# Patient Record
Sex: Female | Born: 1985
Health system: Southern US, Community
[De-identification: ages and names within clinical notes are randomized; demographics above are authoritative.]

## PROBLEM LIST (undated history)

## (undated) DIAGNOSIS — F419 Anxiety disorder, unspecified: Secondary | ICD-10-CM

## (undated) HISTORY — PX: WISDOM TOOTH EXTRACTION: SHX21

## (undated) HISTORY — PX: TYMPANOSTOMY TUBE PLACEMENT: SHX32

## (undated) HISTORY — PX: TONSILLECTOMY: SUR1361

---

## 2010-04-24 ENCOUNTER — Ambulatory Visit: Payer: Self-pay | Admitting: Internal Medicine

## 2017-09-02 DIAGNOSIS — K529 Noninfective gastroenteritis and colitis, unspecified: Secondary | ICD-10-CM | POA: Diagnosis not present

## 2017-11-18 DIAGNOSIS — Z124 Encounter for screening for malignant neoplasm of cervix: Secondary | ICD-10-CM | POA: Diagnosis not present

## 2017-11-18 DIAGNOSIS — Z0000 Encounter for general adult medical examination without abnormal findings: Secondary | ICD-10-CM | POA: Diagnosis not present

## 2018-06-06 DIAGNOSIS — L259 Unspecified contact dermatitis, unspecified cause: Secondary | ICD-10-CM | POA: Diagnosis not present

## 2019-04-26 ENCOUNTER — Other Ambulatory Visit: Payer: Self-pay

## 2019-04-26 ENCOUNTER — Encounter: Payer: Self-pay | Admitting: Allergy and Immunology

## 2019-04-26 ENCOUNTER — Ambulatory Visit: Payer: 59 | Admitting: Allergy and Immunology

## 2019-04-26 VITALS — BP 116/64 | HR 68 | Temp 98.3°F | Resp 18 | Ht 62.3 in | Wt 187.0 lb

## 2019-04-26 DIAGNOSIS — L299 Pruritus, unspecified: Secondary | ICD-10-CM | POA: Diagnosis not present

## 2019-04-26 NOTE — Patient Instructions (Addendum)
  1.  Taper cetirizine 5 mg daily for 1 month followed by 2.5 mg daily for 1 month then discontinue  2.  Further evaluation?

## 2019-04-26 NOTE — Progress Notes (Signed)
[facility] [place]   NEW PATIENT NOTE  Referring Provider: No ref. provider found Primary Provider: Marinda Elk, MD Date of office visit: 04/26/2019    Subjective:   Chief Complaint:  Rhonda Yates (DOB: [birth year]) is a 34 y.o. female who presents to the clinic on 04/26/2019 with a chief complaint of Pruritus .  HPI: Rhonda Yates presents to this clinic in evaluation of itchiness if she is not using Zyrtec.  Apparently she has been on Zyrtec for greater than a decade for issues with pollen allergies usually involving her upper airway.  If she stops Zyrtec at this point she develops itchiness within 24 to 48 hours.  She has a history of dermatographia which has been a longstanding issue without any other associated systemic or constitutional symptoms and that also appears to possibly get a little bit worse when she stops her Zyrtec.  Overall her atopic disease appears to have been better as she has aged.  She does not really have a history of asthma or food allergies or other atopic disease other than some allergic rhinitis.  She has not had a investigation of her pruritus.  However, she did have a physical exam in August 2020 and had some blood test performed.  History reviewed. No pertinent past medical history.  Past Surgical History:  Procedure Laterality Date  . TONSILLECTOMY    . TYMPANOSTOMY TUBE PLACEMENT    . WISDOM TOOTH EXTRACTION      Allergies as of 04/26/2019      Reactions   Penicillins    Other reaction(s): Other (See Comments), Unknown Childhood allergy unsure of reaction but can take cephalosporins       Medication List      Cetirizine HCl 10 MG Caps Take 10 mg by mouth daily.   citalopram 10 MG tablet Commonly known as: CELEXA SMARTSIG:3 Tablet(s) By Mouth Every Other Day PRN   multivitamin tablet Take 1 tablet by mouth daily.   PROBIOTIC PO Take by mouth daily.   VITAMIN C PO Take by mouth  daily.       Review of systems negative except as noted in HPI / PMHx or noted below:  Review of Systems  Constitutional: Negative.   HENT: Negative.   Eyes: Negative.   Respiratory: Negative.   Cardiovascular: Negative.   Gastrointestinal: Negative.   Genitourinary: Negative.   Musculoskeletal: Negative.   Skin: Negative.   Neurological: Negative.   Endo/Heme/Allergies: Negative.   Psychiatric/Behavioral: Negative.     Family History  Problem Relation Age of Onset  . Kidney cancer Mother   . High blood pressure Mother   . Cancer Mother        Neuroendocrine cancer    Social History   Socioeconomic History  . Marital status: Married    Spouse name: Not on file  . Number of children: Not on file  . Years of education: Not on file  . Highest education level: Not on file  Occupational History  . Not on file  Tobacco Use  . Smoking status: Never Smoker  . Smokeless tobacco: Never Used  Substance and Sexual Activity  . Alcohol use: Yes  . Drug use: Never  . Sexual activity: Not on file  Other Topics Concern  . Not on file  Social History Narrative  . Not on file   Social Determinants of Health   Financial Resource Strain:   . Difficulty of Paying Living Expenses: Not  on file  Food Insecurity:   . Worried About Charity fundraiser in the Last Year: Not on file  . Ran Out of Food in the Last Year: Not on file  Transportation Needs:   . Lack of Transportation (Medical): Not on file  . Lack of Transportation (Non-Medical): Not on file  Physical Activity:   . Days of Exercise per Week: Not on file  . Minutes of Exercise per Session: Not on file  Stress:   . Feeling of Stress : Not on file  Social Connections:   . Frequency of Communication with Friends and Family: Not on file  . Frequency of Social Gatherings with Friends and Family: Not on file  . Attends Religious Services: Not on file  . Active Member of Clubs or Organizations: Not on file  . Attends  Archivist Meetings: Not on file  . Marital Status: Not on file  Intimate Partner Violence:   . Fear of Current or Ex-Partner: Not on file  . Emotionally Abused: Not on file  . Physically Abused: Not on file  . Sexually Abused: Not on file    Environmental and Social history  Lives in a house with a dry environment, a dog located inside the household, no carpet in the bedroom, plastic on the bed, no plastic on the pillow, no smoking ongoing with inside the household.  She works as a Probation officer and evaluates patients.  Objective:   Vitals:   04/26/19 1347  BP: 116/64  Pulse: 68  Resp: 18  Temp: 98.3 F (36.8 C)  SpO2: 97%   Height: 5' 2.3" (158.2 cm) Weight: 187 lb (84.8 kg)  Physical Exam Constitutional:      Appearance: She is not diaphoretic.  HENT:     Head: Normocephalic. No right periorbital erythema or left periorbital erythema.     Right Ear: Tympanic membrane, ear canal and external ear normal.     Left Ear: Tympanic membrane, ear canal and external ear normal.     Nose: Nose normal. No mucosal edema or rhinorrhea.     Mouth/Throat:     Pharynx: Uvula midline. No oropharyngeal exudate.  Eyes:     General: Lids are normal.     Conjunctiva/sclera: Conjunctivae normal.     Pupils: Pupils are equal, round, and reactive to light.  Neck:     Thyroid: No thyromegaly.     Trachea: Trachea normal. No tracheal tenderness or tracheal deviation.  Cardiovascular:     Rate and Rhythm: Normal rate and regular rhythm.     Heart sounds: Normal heart sounds, S1 normal and S2 normal. No murmur.  Pulmonary:     Effort: Pulmonary effort is normal. No respiratory distress.     Breath sounds: Normal breath sounds. No stridor. No wheezing or rales.  Chest:     Chest wall: No tenderness.  Abdominal:     General: There is no distension.     Palpations: Abdomen is soft. There is no mass.     Tenderness: There is no abdominal tenderness. There is no guarding or  rebound.  Musculoskeletal:        General: No tenderness.  Lymphadenopathy:     Head:     Right side of head: No tonsillar adenopathy.     Left side of head: No tonsillar adenopathy.     Cervical: No cervical adenopathy.  Skin:    Coloration: Skin is not pale.     Findings: No erythema or rash.  Nails: There is no clubbing.  Neurological:     Mental Status: She is alert.     Diagnostics: Allergy skin tests were not performed.   Results of blood tests obtained 20 November 2018 identifies a WBC 5.9, absolute eosinophil 60, absolute lymphocyte 2310, hemoglobin 14.7, platelet 292, creatinine 0.8 mg/DL, AST 20 1U/L, ALT 20 9U/L, negative hepatitis C antibody  Assessment and Plan:    1. Pruritic disorder     1.  Taper cetirizine 5 mg daily for 1 month followed by 2.5 mg daily for 1 month then discontinue  2.  Further evaluation?  Before we investigate for a possible systemic disease or immune activation regarding Sherin's pruritic disorder we are going to have her slowly taper off Zyrtec.  I suspect the issue that we are dealing with at this point regarding her pruritus is 1 of cetirizine withdrawal and we will slowly taper her off this agent to see if this is the case.  If she still remains pruritic after this slow taper then she may require further evaluation and she will contact me by clinic noting her response to this tapering.  Allena Katz, MD Allergy / Immunology [place]

## 2019-04-30 ENCOUNTER — Encounter: Payer: Self-pay | Admitting: Allergy and Immunology

## 2019-05-30 ENCOUNTER — Telehealth: Payer: Self-pay | Admitting: Allergy and Immunology

## 2019-05-30 NOTE — Telephone Encounter (Signed)
Rhonda Yates called in and states she has followed Dr. Bruna Potter advice on tapering herself off Zyrtec with the exception of the 1/4 pill.  She is currently taking 1/2 a Zyrtec daily.  Dorenda states she can't find a pill cutter that can cut the pill into a 1/4.  She tried to see how it would go by stopping the 1/2 pill and she states she is still having the itching.  Alexah states she is still getting red blotches everywhere.  Please advise.

## 2019-05-31 NOTE — Telephone Encounter (Signed)
Patient informed. 

## 2019-05-31 NOTE — Telephone Encounter (Signed)
Please inform patient that she can use liquid cetirizine which is 5 mg / 5 mL to continue her very slow taper off this medication.  If she cannot taper off this medicine completely and developed skin problems while utilizing this plan then she needs to contact me for further evaluation and treatment.

## 2019-07-31 ENCOUNTER — Other Ambulatory Visit: Payer: Self-pay

## 2019-07-31 ENCOUNTER — Ambulatory Visit
Admission: EM | Admit: 2019-07-31 | Discharge: 2019-07-31 | Disposition: A | Discharge status: Home / Self Care | Payer: 59 | Attending: Family Medicine | Admitting: Family Medicine

## 2019-07-31 ENCOUNTER — Encounter: Payer: Self-pay | Admitting: Emergency Medicine

## 2019-07-31 DIAGNOSIS — R059 Cough, unspecified: Secondary | ICD-10-CM

## 2019-07-31 DIAGNOSIS — H66001 Acute suppurative otitis media without spontaneous rupture of ear drum, right ear: Secondary | ICD-10-CM | POA: Diagnosis not present

## 2019-07-31 DIAGNOSIS — R05 Cough: Secondary | ICD-10-CM

## 2019-07-31 MED ORDER — CEFDINIR 300 MG PO CAPS: 300.0000 mg | ORAL_CAPSULE | Freq: Two times a day (BID) | ORAL | 0 refills | Status: AC

## 2019-07-31 NOTE — Discharge Instructions (Signed)
It was very nice seeing you today in clinic. Thank you for entrusting me with your care.   Rest and Stay HYDRATED. Water and electrolyte containing beverages (Gatorade, Pedialyte) are best to prevent dehydration and electrolyte abnormalities. Call us and let us know about your COVID test results when you get them today. May use Tylenol and/or Ibuprofen as needed for pain/fever.   Make arrangements to follow up with your regular doctor in 1 week for re-evaluation if not improving. If your symptoms/condition worsens, please seek follow up care either here or in the ER. Please remember, our [facility] providers are "right here with you" when you need [place].   Again, it was my pleasure to take care of you today. Thank you for choosing our clinic. I hope that you start to feel better quickly.   Honor Loh, MSN, APRN, FNP-C, CEN Advanced Practice Provider [facility]

## 2019-07-31 NOTE — ED Provider Notes (Addendum)
[place], [place]   Name: Rhonda Yates DOB: Mar 01, 1986 MRN: [id number] CSN: [id number] PCP: Marinda Elk, MD  Arrival date and time:  07/31/19 1522  Chief Complaint:  Otalgia, Fever, and Nasal Congestion  NOTE: Prior to seeing the patient today, I have reviewed the triage nursing documentation and vital signs. Clinical staff has updated patient's PMH/PSHx, current medication list, and drug allergies/intolerances to ensure comprehensive history available to assist in medical decision making.   History:   HPI: Rhonda Yates is a 34 y.o. female who presents today with complaints of rhinorrhea, diffuse myalgia, cough, RIGHT ear pain, and a generalized headache that started yesterday. Patient endorses fevers to a Tmax of 101.2. Cough has been non-productive with no associated shortness of breath or wheezing. She denies that she has experienced any nausea, vomiting, diarrhea, or abdominal pain. She notes a decreased appetite overall, however maintains the ability to tolerate oral fluids. Patient denies any perceived alterations to her sense of taste or smell. Patient denies being in close contact with anyone known to be ill; no one else is her home has experienced a similar symptom constellation. 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 Patient was tested earlier today for SARS-CoV-2 at [facility]. Her rapid Ag testing was (-); molecular PCR testing pending. In efforts to conservatively manage her symptoms at home, the patient notes that she has used APA cold/sinus, guaifenesin-DM, and azelastine, which has helped to improve her symptoms "some".  History reviewed. No pertinent past medical history.  Past Surgical History:  Procedure Laterality Date  . TONSILLECTOMY    . TYMPANOSTOMY TUBE PLACEMENT    . WISDOM TOOTH EXTRACTION      Family History  Problem Relation Age of Onset  .  Kidney cancer Mother   . High blood pressure Mother   . Cancer Mother        Neuroendocrine cancer    Social History   Tobacco Use  . Smoking status: Never Smoker  . Smokeless tobacco: Never Used  Substance Use Topics  . Alcohol use: Yes  . Drug use: Never    There are no problems to display for this patient.   Home Medications:    Current Meds  Medication Sig  . Ascorbic Acid (VITAMIN C PO) Take by mouth daily.  . Cetirizine HCl 10 MG CAPS Take 10 mg by mouth daily.   . citalopram (CELEXA) 10 MG tablet SMARTSIG:3 Tablet(s) By Mouth Every Other Day PRN  . Multiple Vitamin (MULTIVITAMIN) tablet Take 1 tablet by mouth daily.  . Probiotic Product (PROBIOTIC PO) Take by mouth daily.    Allergies:   Penicillins  Review of Systems (ROS):  Review of systems NEGATIVE unless otherwise noted in narrative H&P section.   Vital Signs: Today's Vitals   07/31/19 1547 07/31/19 1549 07/31/19 1637  BP:  126/77   Pulse:  87   Resp:  18   Temp:  100.2 F (37.9 C)   TempSrc:  Oral   SpO2:  99%   Weight: 192 lb (87.1 kg)    Height: 5\' 2"  (1.575 m)    PainSc: 5   5     Physical Exam: Physical Exam  Constitutional: She is oriented to person, place, and time and well-developed, well-nourished, and in no distress.  Acutely ill appearing; fatigued.   HENT:  Head: Normocephalic and atraumatic.  Right Ear: There is swelling and tenderness.  Tympanic membrane is erythematous and bulging. A middle ear effusion (suppurative) is present.  Left Ear: Tympanic membrane is bulging. A middle ear effusion (serous) is present.  Nose: Rhinorrhea present. No mucosal edema or sinus tenderness.  Mouth/Throat: Uvula is midline and mucous membranes are normal. Posterior oropharyngeal erythema present. No oropharyngeal exudate or posterior oropharyngeal edema.  Eyes: Pupils are equal, round, and reactive to light.  Cardiovascular: Normal rate, regular rhythm, normal heart sounds and intact distal  pulses.  Pulmonary/Chest: Effort normal and breath sounds normal.  Mild cough noted in clinic. No SOB or increased WOB. No distress. Able to speak in complete sentences without difficulties. SPO2 99% on RA.  Lymphadenopathy:       Head (right side): Submandibular adenopathy present.  Neurological: She is alert and oriented to person, place, and time. Gait normal.  Skin: Skin is warm and dry. No rash noted. She is not diaphoretic.  Psychiatric: Mood, memory, affect and judgment normal.  Nursing note and vitals reviewed.   Urgent Care Treatments / Results:   No orders of the defined types were placed in this encounter.   LABS: PLEASE NOTE: all labs that were ordered this encounter are listed, however only abnormal results are displayed. Labs Reviewed - No data to display  EKG: -None  RADIOLOGY: No results found.  PROCEDURES: Procedures  MEDICATIONS RECEIVED THIS VISIT: Medications - No data to display  PERTINENT CLINICAL COURSE NOTES/UPDATES:   Initial Impression / Assessment and Plan / Urgent Care Course:  Pertinent labs & imaging results that were available during my care of the patient were personally reviewed by me and considered in my medical decision making (see lab/imaging section of note for values and interpretations).  Rhonda Yates is a 34 y.o. female who presents to [facility] today with complaints of Otalgia, Fever, and Nasal Congestion  Patient acutely ill appearing (non-toxic) appearing in clinic today. She does not appear to be in any acute distress. Presenting symptoms (see HPI) and exam as documented above. She presents with symptoms associated with SARS-CoV-2 (novel coronavirus). Discussed typical symptom constellation. Rapid testing (-) at outside facility; molecular PCR testing pending. Exam consistent with AOME on the RIGHT. Discussed potential for concurrent ear infection, thus until ruled out with confirmatory lab testing, SARS-CoV-2 remains  part of the differential. Treating with a 10 day course of cefdinir. Reviewed supportive day; rest, hydration, and PRN doses of APAP/IBU. Patient to continue guaifenesin and azelastine.  Intervention for cough offered, however patient declined citing that her symptoms are mild/controlled.  Current clinical condition warrants patient being out of work in order to quarantine while waiting for testing results. She was provided with the appropriate documentation to provide to her place of employment that will allow for her to RTW on 08/03/2019 with no restrictions. RTW is contingent on her SARS-CoV-2 test results being reviewed as negative.     Discussed follow up with primary care physician in 1 week for re-evaluation. I have reviewed the follow up and strict return precautions for any new or worsening symptoms. Patient is aware of symptoms that would be deemed urgent/emergent, and would thus require further evaluation either here or in the emergency department. At the time of discharge, she verbalized understanding and consent with the discharge plan as it was reviewed with her. All questions were fielded by provider and/or clinic staff prior to patient discharge.    Final Clinical Impressions / Urgent Care Diagnoses:   Final diagnoses:  Non-recurrent acute suppurative otitis media  of right ear without spontaneous rupture of tympanic membrane  Cough    New Prescriptions:  Industry Controlled Substance Registry consulted? Not Applicable  Meds ordered this encounter  Medications  . cefdinir (OMNICEF) 300 MG capsule    Sig: Take 1 capsule (300 mg total) by mouth 2 (two) times daily for 10 days.    Dispense:  20 capsule    Refill:  0    Recommended Follow up Care:  Patient encouraged to follow up with the following provider within the specified time frame, or sooner as dictated by the severity of her symptoms. As always, she was instructed that for any urgent/emergent care needs, she should seek care  either here or in the emergency department for more immediate evaluation.  Follow-up Information    Marinda Elk, MD In 1 week.   Specialty: Physician Assistant Why: General reassessment of symptoms if not improving Contact information: [place] [address] [phone]         NOTE: This note was prepared using Dragon dictation software along with smaller phrase technology. Despite my best ability to proofread, there is the potential that transcriptional errors may still occur from this process, and are completely unintentional.     Karen Kitchens, NP 07/31/19 1712

## 2019-07-31 NOTE — ED Triage Notes (Addendum)
Patient c/o right ear pain, headache, generalized body aches, nasal congestion and fever that started yesterday. Patient had a negative covid test this morning and states the PCR results should be back this afternoon.

## 2019-11-13 ENCOUNTER — Encounter: Payer: Self-pay | Admitting: Emergency Medicine

## 2019-11-13 ENCOUNTER — Ambulatory Visit
Admission: EM | Admit: 2019-11-13 | Discharge: 2019-11-13 | Disposition: A | Discharge status: Home / Self Care | Payer: 59 | Attending: Family Medicine | Admitting: Family Medicine

## 2019-11-13 ENCOUNTER — Other Ambulatory Visit: Payer: Self-pay

## 2019-11-13 DIAGNOSIS — S90812A Abrasion, left foot, initial encounter: Secondary | ICD-10-CM | POA: Diagnosis not present

## 2019-11-13 HISTORY — DX: Anxiety disorder, unspecified: F41.9

## 2019-11-13 MED ORDER — MUPIROCIN 2 % EX OINT: 1.0000 "application " | TOPICAL_OINTMENT | Freq: Two times a day (BID) | CUTANEOUS | 0 refills | Status: AC

## 2019-11-13 NOTE — Discharge Instructions (Signed)
Keep it clean.  Ice and elevate.  If worsens, let me know and I will send in an oral antibiotic.  Take care  Dr. Lacinda Axon

## 2019-11-13 NOTE — ED Triage Notes (Signed)
Patient in today c/o left foot pain, redness and swelling x 3 days. Patient states she was using a razor to shave and scraped the top of her left foot. Patient cleaned with peroxide, neosporin and covering with bandaids.

## 2019-11-13 NOTE — ED Provider Notes (Signed)
[facility]    CSN: [id number] Arrival date & time: 11/13/19  1727      History   Chief Complaint Chief Complaint  Patient presents with   Foot Injury    left   Abrasion   HPI  34 year old female presents with the above complaint.  Patient reports that she recently shaving and suffered abrasions to the dorsum of the left foot.  Patient reports that she has swam in the [place] recently.  She is concerned as she feels that her left foot is now red and swollen.  She has cleaned the area and used topical Neosporin without resolution.  She states that she was advised by her family members to come in for evaluation.  No fever.  No relieving factors.  No other complaints.  Past Medical History:  Diagnosis Date   Anxiety    Past Surgical History:  Procedure Laterality Date   TONSILLECTOMY     TYMPANOSTOMY TUBE PLACEMENT     WISDOM TOOTH EXTRACTION     OB History   No obstetric history on file.     Home Medications    Prior to Admission medications   Medication Sig Start Date End Date Taking? Authorizing Provider  Ascorbic Acid (VITAMIN C PO) Take by mouth daily.   Yes [provider]  Cetirizine HCl 10 MG CAPS Take 10 mg by mouth daily.    Yes [provider]  citalopram (CELEXA) 10 MG tablet SMARTSIG:3 Tablet(s) By Mouth Every Other Day PRN 04/23/19  Yes [provider]  Prenatal Vit-Fe Fumarate-FA (PRENATAL PO) Take 1 tablet by mouth daily.   Yes [provider]  Probiotic Product (PROBIOTIC PO) Take by mouth daily.   Yes [provider]  Multiple Vitamin (MULTIVITAMIN) tablet Take 1 tablet by mouth daily.    [provider]  mupirocin ointment (BACTROBAN) 2 % Apply 1 application topically 2 (two) times daily for 7 days. 11/13/19 11/20/19  Coral Spikes, DO    Family History Family History  Problem Relation Age of Onset   Kidney cancer Mother    High blood pressure Mother    Cancer Mother         Neuroendocrine cancer   Healthy Father     Social History Social History   Tobacco Use   Smoking status: Never Smoker   Smokeless tobacco: Never Used  Scientific laboratory technician Use: Never used  Substance Use Topics   Alcohol use: Yes    Comment: social   Drug use: Never     Allergies   Penicillins   Review of Systems Review of Systems  Constitutional: Negative.   Musculoskeletal:       Foot swelling.   Physical Exam Triage Vital Signs ED Triage Vitals  Enc Vitals Group     BP 11/13/19 1740 115/78     Pulse Rate 11/13/19 1740 (!) 57     Resp 11/13/19 1740 18     Temp 11/13/19 1740 98.5 F (36.9 C)     Temp Source 11/13/19 1740 Oral     SpO2 11/13/19 1740 100 %     Weight 11/13/19 1741 192 lb 3.2 oz (87.2 kg)     Height 11/13/19 1741 5\' 2"  (1.575 m)     Head Circumference --      Peak Flow --      Pain Score 11/13/19 1740 2     Pain Loc --      Pain Edu? --  Excl. in [place]? --    Updated Vital Signs BP 115/78 (BP Location: Left Arm)    Pulse (!) 57    Temp 98.5 F (36.9 C) (Oral)    Resp 18    Ht 5\' 2"  (1.575 m)    Wt 87.2 kg    LMP 10/17/2019 (Exact Date)    SpO2 100%    BMI 35.15 kg/m   Visual Acuity Right Eye Distance:   Left Eye Distance:   Bilateral Distance:    Right Eye Near:   Left Eye Near:    Bilateral Near:     Physical Exam Constitutional:      General: She is not in acute distress.    Appearance: Normal appearance. She is not ill-appearing.  HENT:     Head: Normocephalic and atraumatic.  Eyes:     General:        Right eye: No discharge.        Left eye: No discharge.     Conjunctiva/sclera: Conjunctivae normal.  Pulmonary:     Effort: Pulmonary effort is normal. No respiratory distress.  Skin:    Comments: Dorsum of the left foot with 2 small abrasions.  There is no significant surrounding erythema or warmth.  Neurological:     Mental Status: She is alert.  Psychiatric:        Mood and Affect: Mood normal.        Behavior:  Behavior normal.    UC Treatments / Results  Labs (all labs ordered are listed, but only abnormal results are displayed) Labs Reviewed - No data to display  EKG   Radiology No results found.  Procedures Procedures (including critical care time)  Medications Ordered in UC Medications - No data to display  Initial Impression / Assessment and Plan / UC Course  I have reviewed the triage vital signs and the nursing notes.  Pertinent labs & imaging results that were available during my care of the patient were reviewed by me and considered in my medical decision making (see chart for details).    34 year old female presents with an abrasion to the left foot.  Patient actually has 2 abrasions.  There is no evidence of cellulitis at this time.  No significant swelling on exam.  Bactroban ointment as prescribed.  Supportive care.  Final Clinical Impressions(s) / UC Diagnoses   Final diagnoses:  Abrasion of left foot, initial encounter     Discharge Instructions     Keep it clean.  Ice and elevate.  If worsens, let me know and I will send in an oral antibiotic.  Take care  Dr. Lacinda Axon    ED Prescriptions    Medication Sig Dispense Auth. Provider   mupirocin ointment (BACTROBAN) 2 % Apply 1 application topically 2 (two) times daily for 7 days. 22 g Coral Spikes, DO     PDMP not reviewed this encounter.   Coral Spikes, [place] 11/13/19 1835

## 2020-01-08 ENCOUNTER — Encounter: Payer: Self-pay | Admitting: Obstetrics & Gynecology

## 2020-01-08 ENCOUNTER — Ambulatory Visit (INDEPENDENT_AMBULATORY_CARE_PROVIDER_SITE_OTHER): Payer: 59 | Admitting: Obstetrics & Gynecology

## 2020-01-08 ENCOUNTER — Other Ambulatory Visit (HOSPITAL_COMMUNITY)
Admission: RE | Admit: 2020-01-08 | Discharge: 2020-01-08 | Disposition: A | Discharge status: Home / Self Care | Payer: 59 | Source: Ambulatory Visit | Attending: Obstetrics & Gynecology | Admitting: Obstetrics & Gynecology

## 2020-01-08 ENCOUNTER — Other Ambulatory Visit: Payer: Self-pay

## 2020-01-08 VITALS — BP 126/70 | Wt 197.0 lb

## 2020-01-08 DIAGNOSIS — N926 Irregular menstruation, unspecified: Secondary | ICD-10-CM

## 2020-01-08 DIAGNOSIS — Z3A08 8 weeks gestation of pregnancy: Secondary | ICD-10-CM

## 2020-01-08 DIAGNOSIS — Z369 Encounter for antenatal screening, unspecified: Secondary | ICD-10-CM

## 2020-01-08 LAB — POCT URINE PREGNANCY: Preg Test, Ur: POSITIVE — AB

## 2020-01-08 NOTE — Progress Notes (Signed)
01/08/2020   Chief Complaint: Missed period  Transfer of Care Patient: no  History of Present Illness: Rhonda Yates is a 34 y.o. G1P0 [redacted]w[redacted]d based on Patient's last menstrual period was 11/13/2019. with an Estimated Date of Delivery: 08/19/20, with the above CC.   Her periods were: irregular periods from 45 to 60 days She was using no method when she conceived.  She has Positive signs or symptoms of nausea/vomiting of pregnancy. She has Negative signs or symptoms of miscarriage or preterm labor She identifies Negative Zika risk factors for her and her partner On any different medications around the time she conceived/early pregnancy: Yes   - Pt takes Celexa History of varicella: Yes   ROS: A 12-point review of systems was performed and negative, except as stated in the above HPI.  OBGYN History: As per HPI. OB History  Gravida Para Term Preterm AB Living  1            SAB TAB Ectopic Multiple Live Births               # Outcome Date GA Lbr Len/2nd Weight Sex Delivery Anes PTL Lv  1 Current             Any issues with any prior pregnancies: N/A Any prior children are healthy, doing well, without any problems or issues: not applicable History of pap smears: Yes. Last pap smear 2020.  Abnormal: no  History of STIs: No   Past Medical History: Past Medical History:  Diagnosis Date  . Anxiety     Past Surgical History: Past Surgical History:  Procedure Laterality Date  . TONSILLECTOMY    . TYMPANOSTOMY TUBE PLACEMENT    . WISDOM TOOTH EXTRACTION      Family History:  Family History  Problem Relation Age of Onset  . Kidney cancer Mother   . High blood pressure Mother   . Cancer Mother        Neuroendocrine cancer  . Healthy Father    She denies any female cancers, bleeding or blood clotting disorders.  She denies any history of mental retardation, birth defects or genetic disorders in her or the FOB's history.  One cousin w Energy manager  Social History:  Social History     Socioeconomic History  . Marital status: Married    Spouse name: Not on file  . Number of children: Not on file  . Years of education: Not on file  . Highest education level: Not on file  Occupational History  . Not on file  Tobacco Use  . Smoking status: Never Smoker  . Smokeless tobacco: Never Used  Vaping Use  . Vaping Use: Never used  Substance and Sexual Activity  . Alcohol use: Yes    Comment: social  . Drug use: Never  . Sexual activity: Yes  Other Topics Concern  . Not on file  Social History Narrative  . Not on file   Social Determinants of Health   Financial Resource Strain:   . Difficulty of Paying Living Expenses: Not on file  Food Insecurity:   . Worried About Charity fundraiser in the Last Year: Not on file  . Ran Out of Food in the Last Year: Not on file  Transportation Needs:   . Lack of Transportation (Medical): Not on file  . Lack of Transportation (Non-Medical): Not on file  Physical Activity:   . Days of Exercise per Week: Not on file  . Minutes of Exercise per Session:  Not on file  Stress:   . Feeling of Stress : Not on file  Social Connections:   . Frequency of Communication with Friends and Family: Not on file  . Frequency of Social Gatherings with Friends and Family: Not on file  . Attends Religious Services: Not on file  . Active Member of Clubs or Organizations: Not on file  . Attends Archivist Meetings: Not on file  . Marital Status: Not on file  Intimate Partner Violence:   . Fear of Current or Ex-Partner: Not on file  . Emotionally Abused: Not on file  . Physically Abused: Not on file  . Sexually Abused: Not on file   Any pets in the household: no  Allergy: Allergies  Allergen Reactions  . Penicillins     Other reaction(s): Other (See Comments), Unknown Childhood allergy unsure of reaction but can take cephalosporins      Current Outpatient Medications:  Current Outpatient Medications:  .  Cetirizine HCl 10  MG CAPS, Take 10 mg by mouth daily. , Disp: , Rfl:  .  citalopram (CELEXA) 10 MG tablet, SMARTSIG:3 Tablet(s) By Mouth Every Other Day PRN, Disp: , Rfl:  .  Multiple Vitamin (MULTIVITAMIN) tablet, Take 1 tablet by mouth daily., Disp: , Rfl:  .  Prenatal Vit-Fe Fumarate-FA (PRENATAL PO), Take 1 tablet by mouth daily., Disp: , Rfl:  .  Probiotic Product (PROBIOTIC PO), Take by mouth daily., Disp: , Rfl:  .  Ascorbic Acid (VITAMIN C PO), Take by mouth daily. (Patient not taking: Reported on 01/08/2020), Disp: , Rfl:    Physical Exam:   BP 126/70   Wt 197 lb (89.4 kg)   LMP 11/13/2019   BMI 36.03 kg/m  Body mass index is 36.03 kg/m. Constitutional: Well nourished, well developed female in no acute distress.  Neck:  Supple, normal appearance, and no thyromegaly  Cardiovascular: S1, S2 normal, no murmur, rub or gallop, regular rate and rhythm Respiratory:  Clear to auscultation bilateral. Normal respiratory effort Abdomen: positive bowel sounds and no masses, hernias; diffusely non tender to palpation, non distended Breasts: breasts appear normal, no suspicious masses, no skin or nipple changes or axillary nodes. Neuro/Psych:  Normal mood and affect.  Skin:  Warm and dry.  Lymphatic:  No inguinal lymphadenopathy.   Pelvic exam: is not limited by body habitus EGBUS: within normal limits, Vagina: within normal limits and with no blood in the vault, Cervix: normal appearing cervix without discharge or lesions, closed/long/high, Uterus:  enlarged: 6 weeks, and Adnexa:  normal adnexa  Assessment: Rhonda Yates is a 34 y.o. G1P0 [redacted]w[redacted]d based on Patient's last menstrual period was 11/13/2019. with an Estimated Date of Delivery: 08/19/20,  for prenatal care.  Plan:  1) Avoid alcoholic beverages. 2) Patient encouraged not to smoke.  3) Discontinue the use of all non-medicinal drugs and chemicals.  4) Take prenatal vitamins daily.  5) Seatbelt use advised 6) Nutrition, food safety (fish, cheese  advisories, and high nitrite foods) and exercise discussed. 7) Hospital and practice style delivering at [facility] discussed  8) Patient is asked about travel to areas at risk for the [place] virus, and counseled to avoid travel and exposure to mosquitoes or sexual partners who may have themselves been exposed to the virus. Testing is discussed, and will be ordered as appropriate.  9) Childbirth classes at [facility] advised 10) Genetic Screening, such as with 1st Trimester Screening, cell free fetal DNA, AFP testing, and Ultrasound, as well as with amniocentesis and CVS  as appropriate, is discussed with patient. She plans to have genetic testing this pregnancy. 11) [place] soon as has irreg menses and likely inaccurate dating at this time 34) Cont Celexa, option for switch to Zoloft or no therapy discussed.  Problem list reviewed and updated.  Barnett Applebaum, MD, [facility], [facility] 01/08/2020  11:15 AM

## 2020-01-08 NOTE — Patient Instructions (Signed)
Due Date 08/19/2020   First Trimester of Pregnancy The first trimester of pregnancy is from week 1 until the end of week 13 (months 1 through 3). A week after a sperm fertilizes an egg, the egg will implant on the wall of the uterus. This embryo will begin to develop into a baby. Genes from you and your partner will form the baby. The female genes will determine whether the baby will be a boy or a girl. At 6-8 weeks, the eyes and face will be formed, and the heartbeat can be seen on ultrasound. At the end of 12 weeks, all the baby's organs will be formed. Now that you are pregnant, you will want to do everything you can to have a healthy baby. Two of the most important things are to get good prenatal care and to follow your health care provider's instructions. Prenatal care is all the medical care you receive before the baby's birth. This care will help prevent, find, and treat any problems during the pregnancy and childbirth. Body changes during your first trimester Your body goes through many changes during pregnancy. The changes vary from woman to woman.  You may gain or lose a couple of pounds at first.  You may feel sick to your stomach (nauseous) and you may throw up (vomit). If the vomiting is uncontrollable, call your health care provider.  You may tire easily.  You may develop headaches that can be relieved by medicines. All medicines should be approved by your health care provider.  You may urinate more often. Painful urination may mean you have a bladder infection.  You may develop heartburn as a result of your pregnancy.  You may develop constipation because certain hormones are causing the muscles that push stool through your intestines to slow down.  You may develop hemorrhoids or swollen veins (varicose veins).  Your breasts may begin to grow larger and become tender. Your nipples may stick out more, and the tissue that surrounds them (areola) may become darker.  Your gums may  bleed and may be sensitive to brushing and flossing.  Dark spots or blotches (chloasma, mask of pregnancy) may develop on your face. This will likely fade after the baby is born.  Your menstrual periods will stop.  You may have a loss of appetite.  You may develop cravings for certain kinds of food.  You may have changes in your emotions from day to day, such as being excited to be pregnant or being concerned that something may go wrong with the pregnancy and baby.  You may have more vivid and strange dreams.  You may have changes in your hair. These can include thickening of your hair, rapid growth, and changes in texture. Some women also have hair loss during or after pregnancy, or hair that feels dry or thin. Your hair will most likely return to normal after your baby is born. What to expect at prenatal visits During a routine prenatal visit:  You will be weighed to make sure you and the baby are growing normally.  Your blood pressure will be taken.  Your abdomen will be measured to track your baby's growth.  The fetal heartbeat will be listened to between weeks 10 and 14 of your pregnancy.  Test results from any previous visits will be discussed. Your health care provider may ask you:  How you are feeling.  If you are feeling the baby move.  If you have had any abnormal symptoms, such as leaking fluid,  bleeding, severe headaches, or abdominal cramping.  If you are using any tobacco products, including cigarettes, chewing tobacco, and electronic cigarettes.  If you have any questions. Other tests that may be performed during your first trimester include:  Blood tests to find your blood type and to check for the presence of any previous infections. The tests will also be used to check for low iron levels (anemia) and protein on red blood cells (Rh antibodies). Depending on your risk factors, or if you previously had diabetes during pregnancy, you may have tests to check for  high blood sugar that affects pregnant women (gestational diabetes).  Urine tests to check for infections, diabetes, or protein in the urine.  An ultrasound to confirm the proper growth and development of the baby.  Fetal screens for spinal cord problems (spina bifida) and Down syndrome.  HIV (human immunodeficiency virus) testing. Routine prenatal testing includes screening for HIV, unless you choose not to have this test.  You may need other tests to make sure you and the baby are doing well. Follow these instructions at home: Medicines  Follow your health care provider's instructions regarding medicine use. Specific medicines may be either safe or unsafe to take during pregnancy.  Take a prenatal vitamin that contains at least 600 micrograms (mcg) of folic acid.  If you develop constipation, try taking a stool softener if your health care provider approves. Eating and drinking   Eat a balanced diet that includes fresh fruits and vegetables, whole grains, good sources of protein such as meat, eggs, or tofu, and low-fat dairy. Your health care provider will help you determine the amount of weight gain that is right for you.  Avoid raw meat and uncooked cheese. These carry germs that can cause birth defects in the baby.  Eating four or five small meals rather than three large meals a day may help relieve nausea and vomiting. If you start to feel nauseous, eating a few soda crackers can be helpful. Drinking liquids between meals, instead of during meals, also seems to help ease nausea and vomiting.  Limit foods that are high in fat and processed sugars, such as fried and sweet foods.  To prevent constipation: ? Eat foods that are high in fiber, such as fresh fruits and vegetables, whole grains, and beans. ? Drink enough fluid to keep your urine clear or pale yellow. Activity  Exercise only as directed by your health care provider. Most women can continue their usual exercise routine  during pregnancy. Try to exercise for 30 minutes at least 5 days a week. Exercising will help you: ? Control your weight. ? Stay in shape. ? Be prepared for labor and delivery.  Experiencing pain or cramping in the lower abdomen or lower back is a good sign that you should stop exercising. Check with your health care provider before continuing with normal exercises.  Try to avoid standing for long periods of time. Move your legs often if you must stand in one place for a long time.  Avoid heavy lifting.  Wear low-heeled shoes and practice good posture.  You may continue to have sex unless your health care provider tells you not to. Relieving pain and discomfort  Wear a good support bra to relieve breast tenderness.  Take warm sitz baths to soothe any pain or discomfort caused by hemorrhoids. Use hemorrhoid cream if your health care provider approves.  Rest with your legs elevated if you have leg cramps or low back pain.  If  you develop varicose veins in your legs, wear support hose. Elevate your feet for 15 minutes, 3-4 times a day. Limit salt in your diet. Prenatal care  Schedule your prenatal visits by the twelfth week of pregnancy. They are usually scheduled monthly at first, then more often in the last 2 months before delivery.  Write down your questions. Take them to your prenatal visits.  Keep all your prenatal visits as told by your health care provider. This is important. Safety  Wear your seat belt at all times when driving.  Make a list of emergency phone numbers, including numbers for family, friends, the hospital, and police and fire departments. General instructions  Ask your health care provider for a referral to a local prenatal education class. Begin classes no later than the beginning of month 6 of your pregnancy.  Ask for help if you have counseling or nutritional needs during pregnancy. Your health care provider can offer advice or refer you to specialists  for help with various needs.  Do not use hot tubs, steam rooms, or saunas.  Do not douche or use tampons or scented sanitary pads.  Do not cross your legs for long periods of time.  Avoid cat litter boxes and soil used by cats. These carry germs that can cause birth defects in the baby and possibly loss of the fetus by miscarriage or stillbirth.  Avoid all smoking, herbs, alcohol, and medicines not prescribed by your health care provider. Chemicals in these products affect the formation and growth of the baby.  Do not use any products that contain nicotine or tobacco, such as cigarettes and e-cigarettes. If you need help quitting, ask your health care provider. You may receive counseling support and other resources to help you quit.  Schedule a dentist appointment. At home, brush your teeth with a soft toothbrush and be gentle when you floss. Contact a health care provider if:  You have dizziness.  You have mild pelvic cramps, pelvic pressure, or nagging pain in the abdominal area.  You have persistent nausea, vomiting, or diarrhea.  You have a bad smelling vaginal discharge.  You have pain when you urinate.  You notice increased swelling in your face, hands, legs, or ankles.  You are exposed to fifth disease or chickenpox.  You are exposed to [place] measles (rubella) and have never had it. Get help right away if:  You have a fever.  You are leaking fluid from your vagina.  You have spotting or bleeding from your vagina.  You have severe abdominal cramping or pain.  You have rapid weight gain or loss.  You vomit blood or material that looks like coffee grounds.  You develop a severe headache.  You have shortness of breath.  You have any kind of trauma, such as from a fall or a car accident. Summary  The first trimester of pregnancy is from week 1 until the end of week 13 (months 1 through 3).  Your body goes through many changes during pregnancy. The changes  vary from woman to woman.  You will have routine prenatal visits. During those visits, your health care provider will examine you, discuss any test results you may have, and talk with you about how you are feeling. This information is not intended to replace advice given to you by your health care provider. Make sure you discuss any questions you have with your health care provider. Document Revised: 02/25/2017 Document Reviewed: 02/25/2016 Elsevier Patient Education  2020 [employer].

## 2020-01-09 LAB — CYTOLOGY - PAP
Chlamydia: NEGATIVE
Comment: NEGATIVE
Comment: NEGATIVE
Comment: NORMAL
Neisseria Gonorrhea: NEGATIVE
Trichomonas: NEGATIVE

## 2020-01-09 LAB — RPR+RH+ABO+RUB AB+AB SCR+CB...
Antibody Screen: NEGATIVE
HIV Screen 4th Generation wRfx: NONREACTIVE
Hematocrit: 41.8 % (ref 34.0–46.6)
Hemoglobin: 13.5 g/dL (ref 11.1–15.9)
Hepatitis B Surface Ag: NEGATIVE
MCH: 28.1 pg (ref 26.6–33.0)
MCHC: 32.3 g/dL (ref 31.5–35.7)
MCV: 87 fL (ref 79–97)
Platelets: 346 10*3/uL (ref 150–450)
RBC: 4.8 x10E6/uL (ref 3.77–5.28)
RDW: 13.2 % (ref 11.7–15.4)
RPR Ser Ql: NONREACTIVE
Rh Factor: POSITIVE
Rubella Antibodies, IGG: 3.98 index (ref >0.99)
Varicella zoster IgG: 783 index (ref >165)
WBC: 9.8 10*3/uL (ref 3.4–10.8)

## 2020-01-10 LAB — URINE CULTURE: Organism ID, Bacteria: NO GROWTH

## 2020-01-14 ENCOUNTER — Other Ambulatory Visit: Payer: Self-pay | Admitting: Obstetrics & Gynecology

## 2020-01-14 MED ORDER — ONDANSETRON 4 MG PO TBDP: 4.0000 mg | ORAL_TABLET | Freq: Four times a day (QID) | ORAL | 0 refills | Status: DC | PRN

## 2020-01-14 MED ORDER — DOXYLAMINE-PYRIDOXINE 10-10 MG PO TBEC: 2.0000 | DELAYED_RELEASE_TABLET | Freq: Every day | ORAL | 5 refills | Status: DC

## 2020-01-14 NOTE — Telephone Encounter (Signed)
Can you send her in something ?

## 2020-01-15 MED ORDER — BONJESTA 20-20 MG PO TBCR: 1.0000 | EXTENDED_RELEASE_TABLET | Freq: Two times a day (BID) | ORAL | 3 refills | Status: AC

## 2020-01-15 NOTE — Telephone Encounter (Signed)
Can you send in [place] to see if this is cheaper.Marland Kitchen

## 2020-01-17 ENCOUNTER — Other Ambulatory Visit: Payer: Self-pay | Admitting: Obstetrics & Gynecology

## 2020-01-17 MED ORDER — ONDANSETRON 4 MG PO TBDP: 4.0000 mg | ORAL_TABLET | Freq: Four times a day (QID) | ORAL | 1 refills | Status: DC | PRN

## 2020-01-17 NOTE — Telephone Encounter (Signed)
Can she just continue with the zofran?

## 2020-01-24 ENCOUNTER — Encounter: Payer: Self-pay | Admitting: Obstetrics & Gynecology

## 2020-01-24 ENCOUNTER — Other Ambulatory Visit: Payer: Self-pay

## 2020-01-24 ENCOUNTER — Ambulatory Visit (INDEPENDENT_AMBULATORY_CARE_PROVIDER_SITE_OTHER): Payer: 59

## 2020-01-24 ENCOUNTER — Ambulatory Visit (INDEPENDENT_AMBULATORY_CARE_PROVIDER_SITE_OTHER): Payer: 59 | Admitting: Obstetrics & Gynecology

## 2020-01-24 ENCOUNTER — Other Ambulatory Visit: Payer: Self-pay | Admitting: Obstetrics & Gynecology

## 2020-01-24 VITALS — BP 130/80 | Wt 190.0 lb

## 2020-01-24 DIAGNOSIS — N926 Irregular menstruation, unspecified: Secondary | ICD-10-CM

## 2020-01-24 DIAGNOSIS — Z3A08 8 weeks gestation of pregnancy: Secondary | ICD-10-CM

## 2020-01-24 DIAGNOSIS — Z131 Encounter for screening for diabetes mellitus: Secondary | ICD-10-CM

## 2020-01-24 DIAGNOSIS — Z3491 Encounter for supervision of normal pregnancy, unspecified, first trimester: Secondary | ICD-10-CM

## 2020-01-24 DIAGNOSIS — Z3492 Encounter for supervision of normal pregnancy, unspecified, second trimester: Secondary | ICD-10-CM | POA: Insufficient documentation

## 2020-01-24 DIAGNOSIS — Z1379 Encounter for other screening for genetic and chromosomal anomalies: Secondary | ICD-10-CM

## 2020-01-24 DIAGNOSIS — Z3493 Encounter for supervision of normal pregnancy, unspecified, third trimester: Secondary | ICD-10-CM | POA: Insufficient documentation

## 2020-01-24 LAB — POCT URINALYSIS DIPSTICK OB
Glucose, UA: NEGATIVE
POC,PROTEIN,UA: NEGATIVE

## 2020-01-24 NOTE — Patient Instructions (Signed)

## 2020-01-24 NOTE — Addendum Note (Signed)
Addended by: Quintella Baton D on: 01/24/2020 09:37 AM   Modules accepted: Orders

## 2020-01-24 NOTE — Progress Notes (Signed)
  Subjective  No pain or bleeding Nausea has improved w use of Unisom, B6, and Zofran  Objective  BP 130/80   Wt 190 lb (86.2 kg)   LMP 11/13/2019   BMI 34.75 kg/m  General: NAD Pumonary: no increased work of breathing Abdomen: gravid, non-tender Extremities: no edema Psychiatric: mood appropriate, affect full  Assessment  34 y.o. G1P0 at [redacted]w[redacted]d by  09/01/2020, by Ultrasound presenting for routine prenatal visit  Plan   Problem List Items Addressed This Visit      Other   Supervision of low-risk pregnancy, first trimester    Other Visit Diagnoses    Screening for diabetes mellitus    -  Primary   Relevant Orders   Glucose, 1 hour gestational   Encounter for genetic screening for Down Syndrome       [redacted] weeks gestation of pregnancy        Declines NIPT, wil revisit this option nv Early glucola sch nv PNV  Review of ULTRASOUND.    I have personally reviewed images and report of recent ultrasound done at [facility].    Plan of management to be discussed with patient. EDC changed due to oligomenorrhea and US findings today, w [facility] 09/01/20    Barnett Applebaum, MD, [facility], [facility] 01/24/2020  9:27 AM

## 2020-02-04 ENCOUNTER — Other Ambulatory Visit: Payer: Self-pay | Admitting: Obstetrics & Gynecology

## 2020-02-12 ENCOUNTER — Other Ambulatory Visit: Payer: Self-pay | Admitting: Obstetrics & Gynecology

## 2020-02-12 MED ORDER — PROMETHAZINE HCL 25 MG PO TABS: 25.0000 mg | ORAL_TABLET | Freq: Four times a day (QID) | ORAL | 2 refills | Status: DC | PRN

## 2020-02-12 NOTE — Telephone Encounter (Signed)
Can she try Phenergan? Please advise

## 2020-02-12 NOTE — Telephone Encounter (Signed)
Does she quit the Zofran while on the phenergan?

## 2020-02-14 NOTE — Telephone Encounter (Signed)
Please advise. Im not sure what else she can try for the vomiting.

## 2020-02-14 NOTE — Telephone Encounter (Signed)
The bonjesta and diclegis cost too much for her.

## 2020-02-20 ENCOUNTER — Other Ambulatory Visit: Payer: 59

## 2020-02-20 ENCOUNTER — Other Ambulatory Visit: Payer: 59 | Admitting: Obstetrics & Gynecology

## 2020-02-20 ENCOUNTER — Ambulatory Visit (INDEPENDENT_AMBULATORY_CARE_PROVIDER_SITE_OTHER): Payer: 59 | Admitting: Obstetrics & Gynecology

## 2020-02-20 ENCOUNTER — Other Ambulatory Visit: Payer: Self-pay

## 2020-02-20 VITALS — BP 132/78 | Wt 182.0 lb

## 2020-02-20 DIAGNOSIS — O99211 Obesity complicating pregnancy, first trimester: Secondary | ICD-10-CM

## 2020-02-20 DIAGNOSIS — Z131 Encounter for screening for diabetes mellitus: Secondary | ICD-10-CM

## 2020-02-20 DIAGNOSIS — Z3491 Encounter for supervision of normal pregnancy, unspecified, first trimester: Secondary | ICD-10-CM

## 2020-02-20 DIAGNOSIS — Z3A12 12 weeks gestation of pregnancy: Secondary | ICD-10-CM

## 2020-02-20 LAB — POCT URINALYSIS DIPSTICK OB: POC,PROTEIN,UA: NEGATIVE

## 2020-02-20 NOTE — Progress Notes (Signed)
[name] - No concerns RM10

## 2020-02-20 NOTE — Patient Instructions (Signed)

## 2020-02-20 NOTE — Progress Notes (Signed)
  Subjective  Patient reports significant improvement in N/V since last week. Feeling much better now.   Objective  BP 132/78   Wt 182 lb (82.6 kg)   LMP 11/13/2019   BMI 33.29 kg/m  General: NAD Pumonary: no increased work of breathing Abdomen: gravid, non-tender Extremities: no edema Psychiatric: mood appropriate, affect full  Assessment  34 y.o. G1P0 at [redacted]w[redacted]d by  09/01/2020, by Ultrasound presenting for routine prenatal visit  Plan   Problem List Items Addressed This Visit      Other   Supervision of low-risk pregnancy, first trimester    Other Visit Diagnoses    [redacted] weeks gestation of pregnancy    -  Primary   Relevant Orders   POC Urinalysis Dipstick OB (Completed)   Obesity affecting pregnancy in first trimester              Early 1h GTT today.   Barnett Applebaum, MD, [facility], [facility] 02/20/2020  9:32 AM

## 2020-02-21 LAB — GLUCOSE, 1 HOUR GESTATIONAL: Gestational Diabetes Screen: 141 mg/dL — ABNORMAL HIGH (ref 65–139)

## 2020-02-25 ENCOUNTER — Other Ambulatory Visit: Payer: Self-pay | Admitting: Obstetrics & Gynecology

## 2020-02-25 ENCOUNTER — Telehealth: Payer: Self-pay | Admitting: Obstetrics & Gynecology

## 2020-02-25 DIAGNOSIS — O99810 Abnormal glucose complicating pregnancy: Secondary | ICD-10-CM

## 2020-02-25 NOTE — Telephone Encounter (Signed)
-----   Message from Gae Dry, MD sent at 02/25/2020  7:47 AM EST ----- Sch 3 hour GTT; pt aware you are calling.  Testing for diabetes is concerning due to its elevated results.  Recommend further testing and possible referral to diabetic specialist to determine true extent of this disorder.

## 2020-02-25 NOTE — Progress Notes (Signed)
Sch 3 hour GTT; pt aware you are calling.  Testing for diabetes is concerning due to its elevated results.  Recommend further testing and possible referral to diabetic specialist to determine true extent of this disorder.

## 2020-02-25 NOTE — Telephone Encounter (Signed)
Called and left voicemail for patient to call back to be scheduled. 

## 2020-02-28 ENCOUNTER — Other Ambulatory Visit: Payer: Self-pay

## 2020-02-28 ENCOUNTER — Other Ambulatory Visit: Payer: Self-pay | Admitting: Obstetrics & Gynecology

## 2020-02-28 ENCOUNTER — Emergency Department
Admission: EM | Admit: 2020-02-28 | Discharge: 2020-02-28 | Disposition: A | Discharge status: Home / Self Care | Payer: 59 | Attending: Emergency Medicine | Admitting: Emergency Medicine

## 2020-02-28 ENCOUNTER — Encounter: Payer: Self-pay | Admitting: Emergency Medicine

## 2020-02-28 ENCOUNTER — Emergency Department: Payer: 59

## 2020-02-28 DIAGNOSIS — O219 Vomiting of pregnancy, unspecified: Secondary | ICD-10-CM | POA: Insufficient documentation

## 2020-02-28 DIAGNOSIS — Z3A13 13 weeks gestation of pregnancy: Secondary | ICD-10-CM | POA: Diagnosis not present

## 2020-02-28 LAB — COMPREHENSIVE METABOLIC PANEL
ALT: 28 U/L (ref 0–44)
AST: 23 U/L (ref 15–41)
Albumin: 3.8 g/dL (ref 3.5–5.0)
Alkaline Phosphatase: 56 U/L (ref 38–126)
Anion gap: 11 (ref 5–15)
BUN: 12 mg/dL (ref 6–20)
CO2: 22 mmol/L (ref 22–32)
Calcium: 9 mg/dL (ref 8.9–10.3)
Chloride: 101 mmol/L (ref 98–111)
Creatinine, Ser: 0.53 mg/dL (ref 0.44–1.00)
GFR, Estimated: >60 mL/min (ref >60)
Glucose, Bld: 100 mg/dL — ABNORMAL HIGH (ref 70–99)
Potassium: 3.4 mmol/L — ABNORMAL LOW (ref 3.5–5.1)
Sodium: 134 mmol/L — ABNORMAL LOW (ref 135–145)
Total Bilirubin: 1.8 mg/dL — ABNORMAL HIGH (ref 0.3–1.2)
Total Protein: 7.2 g/dL (ref 6.5–8.1)

## 2020-02-28 LAB — URINALYSIS, COMPLETE (UACMP) WITH MICROSCOPIC
Bilirubin Urine: NEGATIVE
Glucose, UA: 50 mg/dL — AB
Hgb urine dipstick: NEGATIVE
Ketones, ur: 80 mg/dL — AB
Leukocytes,Ua: NEGATIVE
Nitrite: NEGATIVE
Protein, ur: 100 mg/dL — AB
Specific Gravity, Urine: 1.03 (ref 1.005–1.030)
pH: 5 (ref 5.0–8.0)

## 2020-02-28 LAB — POC URINE PREG, ED: Preg Test, Ur: POSITIVE — AB

## 2020-02-28 LAB — CBC
HCT: 40.6 % (ref 36.0–46.0)
Hemoglobin: 14.3 g/dL (ref 12.0–15.0)
MCH: 29.6 pg (ref 26.0–34.0)
MCHC: 35.2 g/dL (ref 30.0–36.0)
MCV: 84.1 fL (ref 80.0–100.0)
Platelets: 278 10*3/uL (ref 150–400)
RBC: 4.83 MIL/uL (ref 3.87–5.11)
RDW: 12.8 % (ref 11.5–15.5)
WBC: 9.3 10*3/uL (ref 4.0–10.5)
nRBC: 0 % (ref 0.0–0.2)

## 2020-02-28 LAB — HCG, QUANTITATIVE, PREGNANCY: hCG, Beta Chain, Quant, S: 96822 m[IU]/mL — ABNORMAL HIGH (ref <5)

## 2020-02-28 LAB — LIPASE, BLOOD: Lipase: 24 U/L (ref 11–51)

## 2020-02-28 MED ORDER — METOCLOPRAMIDE HCL 5 MG PO TABS: 5.0000 mg | ORAL_TABLET | Freq: Three times a day (TID) | ORAL | 0 refills | Status: DC

## 2020-02-28 MED ORDER — ONDANSETRON HCL 4 MG/2ML IJ SOLN
4.0000 mg | Freq: Once | INTRAMUSCULAR | Status: AC
  Administered 2020-02-28: 4 mg via INTRAVENOUS
  Filled 2020-02-28: qty 2

## 2020-02-28 MED ORDER — SODIUM CHLORIDE 0.9 % IV BOLUS
1000.0000 mL | Freq: Once | INTRAVENOUS | Status: AC
  Administered 2020-02-28: 1000 mL via INTRAVENOUS

## 2020-02-28 NOTE — ED Provider Notes (Signed)
[facility] Emergency Department Provider Note  ____________________________________________  Time seen: Approximately 10:20 AM  I have reviewed the triage vital signs and the nursing notes.   HISTORY  Chief Complaint Nausea    HPI Rhonda Yates is a 34 y.o. female that is [redacted] weeks pregnant that presents to the emergency department for evaluation of nausea and vomiting. Patient has had nausea and vomiting for about 2 months intermittently. She sees Dr. Kenton Kingfisher. She has been prescribed Zofran. She usually does not take Zofran because she generally tolerates fluids but usually does not tolerate food. Last night and today, she was also not able to tolerate liquids. She did not take any Zofran. This is her 1st pregnancy.  She recently failed her gestational diabetes screening at [facility] and has to return for another.  No fever, abdominal pain, vaginal bleeding or discharge.   Past Medical History:  Diagnosis Date  . Anxiety     Patient Active Problem List   Diagnosis Date Noted  . Supervision of low-risk pregnancy, first trimester 01/24/2020    Past Surgical History:  Procedure Laterality Date  . TONSILLECTOMY    . TYMPANOSTOMY TUBE PLACEMENT    . WISDOM TOOTH EXTRACTION      Prior to Admission medications   Medication Sig Start Date End Date Taking? Authorizing Provider  Cetirizine HCl 10 MG CAPS Take 10 mg by mouth daily.  Patient not taking: Reported on 02/20/2020    [provider]  citalopram (CELEXA) 10 MG tablet SMARTSIG:3 Tablet(s) By Mouth Every Other Day PRN 04/23/19   [provider]  Doxylamine-Pyridoxine (DICLEGIS) 10-10 MG TBEC Take 2 tablets by mouth at bedtime. If symptoms persist, add one tablet in the morning and one in the afternoon Patient not taking: Reported on 02/20/2020 01/14/20   Gae Dry, MD  metoCLOPramide (REGLAN) 5 MG tablet Take 1 tablet (5 mg total) by mouth 3 (three) times daily for 2 days. 02/28/20  03/01/20  Laban Emperor, PA-C  Multiple Vitamin (MULTIVITAMIN) tablet Take 1 tablet by mouth daily. Patient not taking: Reported on 02/20/2020    [provider]  ondansetron (ZOFRAN-ODT) 4 MG disintegrating tablet TAKE 1 TABLET BY MOUTH EVERY 6 HOURS AS NEEDED FOR NAUSEA 02/05/20   Gae Dry, MD  Prenatal Vit-Fe Fumarate-FA (PRENATAL PO) Take 1 tablet by mouth daily.    [provider]  Probiotic Product (PROBIOTIC PO) Take by mouth daily.    [provider]  promethazine (PHENERGAN) 25 MG tablet Take 1 tablet (25 mg total) by mouth every 6 (six) hours as needed for nausea or vomiting. Patient not taking: Reported on 02/20/2020 02/12/20   Gae Dry, MD    Allergies Penicillins  Family History  Problem Relation Age of Onset  . Kidney cancer Mother   . High blood pressure Mother   . Cancer Mother        Neuroendocrine cancer  . Healthy Father     Social History Social History   Tobacco Use  . Smoking status: Never Smoker  . Smokeless tobacco: Never Used  Vaping Use  . Vaping Use: Never used  Substance Use Topics  . Alcohol use: Yes    Comment: social  . Drug use: Never     Review of Systems  Constitutional: No fever/chills Cardiovascular: No chest pain. Respiratory: No SOB. Gastrointestinal: No abdominal pain.  Positive for nausea and vomiting. Musculoskeletal: Negative for musculoskeletal pain. Skin: Negative for rash, abrasions, lacerations, ecchymosis. Neurological: Negative for  headaches, numbness or tingling   ____________________________________________   PHYSICAL EXAM:  VITAL SIGNS: ED Triage Vitals  Enc Vitals Group     BP 02/28/20 0906 140/72     Pulse Rate 02/28/20 0906 72     Resp 02/28/20 0906 16     Temp 02/28/20 0906 98.6 F (37 C)     Temp Source 02/28/20 0906 Oral     SpO2 02/28/20 0906 96 %     Weight 02/28/20 0900 174 lb (78.9 kg)     Height 02/28/20 0900 5\' 2"  (1.575 m)     Head Circumference --       Peak Flow --      Pain Score 02/28/20 0900 0     Pain Loc --      Pain Edu? --      Excl. in [place]? --      Constitutional: Alert and oriented. Well appearing and in no acute distress. Eyes: Conjunctivae are normal. PERRL. EOMI. Head: Atraumatic. ENT:      Ears:      Nose: No congestion/rhinnorhea.      Mouth/Throat: Mucous membranes are moist.  Neck: No stridor.  Cardiovascular: Normal rate, regular rhythm.  Good peripheral circulation. Respiratory: Normal respiratory effort without tachypnea or retractions. Lungs CTAB. Good air entry to the bases with no decreased or absent breath sounds. Gastrointestinal: Bowel sounds 4 quadrants. Soft and nontender to palpation. No guarding or rigidity. No palpable masses. No distention.  Musculoskeletal: Full range of motion to all extremities. No gross deformities appreciated. Neurologic:  Normal speech and language. No gross focal neurologic deficits are appreciated.  Skin:  Skin is warm, dry and intact. No rash noted. Psychiatric: Mood and affect are normal. Speech and behavior are normal. Patient exhibits appropriate insight and judgement.   ____________________________________________   LABS (all labs ordered are listed, but only abnormal results are displayed)  Labs Reviewed  COMPREHENSIVE METABOLIC PANEL - Abnormal; Notable for the following components:      Result Value   Sodium 134 (*)    Potassium 3.4 (*)    Glucose, Bld 100 (*)    Total Bilirubin 1.8 (*)    All other components within normal limits  URINALYSIS, COMPLETE (UACMP) WITH MICROSCOPIC - Abnormal; Notable for the following components:   Color, Urine AMBER (*)    APPearance HAZY (*)    Glucose, UA 50 (*)    Ketones, ur 80 (*)    Protein, ur 100 (*)    Bacteria, UA FEW (*)    All other components within normal limits  HCG, QUANTITATIVE, PREGNANCY - Abnormal; Notable for the following components:   hCG, Beta Chain, Laqueta Carina 96,822 (*)    All other components  within normal limits  POC URINE PREG, ED - Abnormal; Notable for the following components:   Preg Test, Ur POSITIVE (*)    All other components within normal limits  LIPASE, BLOOD  CBC   ____________________________________________  EKG   ____________________________________________  RADIOLOGY   US OB Comp Less 14 Wks  Result Date: 02/28/2020 CLINICAL DATA:  Vomiting EXAM: OBSTETRIC <14 WK ULTRASOUND TECHNIQUE: Transabdominal ultrasound was performed for evaluation of the gestation as well as the maternal uterus and adnexal regions. COMPARISON:  None. FINDINGS: Intrauterine gestational sac: Single Yolk sac:  Visualized. Embryo:  Visualized. Cardiac Activity: Visualized. Heart Rate: 158 bpm CRL: 77.5 mm   13 w 6 d  [place] EDC: 08/29/2020 Subchorionic hemorrhage:  None visualized. Maternal uterus/adnexae: Unremarkable.  No free fluid. IMPRESSION: Single live intrauterine pregnancy as detailed above. Electronically Signed   By: Macy Mis M.D.   On: 02/28/2020 11:25    ____________________________________________    PROCEDURES  Procedure(s) performed:    Procedures    Medications  sodium chloride 0.9 % bolus 1,000 mL (0 mLs Intravenous Stopped 02/28/20 1229)  ondansetron (ZOFRAN) injection 4 mg (4 mg Intravenous Given 02/28/20 1039)     ____________________________________________   INITIAL IMPRESSION / ASSESSMENT AND PLAN / ED COURSE  Pertinent labs & imaging results that were available during my care of the patient were reviewed by me and considered in my medical decision making (see chart for details).  Review of the [place] CSRS was performed in accordance of the [place] prior to dispensing any controlled drugs.     Patient presented the emergency department for evaluation of nausea and vomiting in pregnancy.  Vital signs and exam are reassuring.  Lab work is largely unremarkable.  Ultrasound shows a single live intrauterine pregnancy at 13 weeks and 6 days  with a heartbeat of 158.  Patient was given a dose of IV Zofran along with fluids in the emergency department.  She is now tolerating crackers and diet [place] mist without difficulty.  She will continue her prescribed Zofran at home.  Patient will be discharged home with prescriptions for Reglan. Patient is to follow up with OB as directed. Patient is given ED precautions to return to the ED for any worsening or new symptoms.   KATRENA STEHLIN was evaluated in Emergency Department on 02/28/2020 for the symptoms described in the history of present illness. She was evaluated in the context of the global COVID-19 pandemic, which necessitated consideration that the patient might be at risk for infection with the SARS-CoV-2 virus that causes COVID-19. Institutional protocols and algorithms that pertain to the evaluation of patients at risk for COVID-19 are in a state of rapid change based on information released by regulatory bodies including the CDC and federal and state organizations. These policies and algorithms were followed during the patient's care in the ED.  ____________________________________________  FINAL CLINICAL IMPRESSION(S) / ED DIAGNOSES  Final diagnoses:  Vomiting during pregnancy      NEW MEDICATIONS STARTED DURING THIS VISIT:  ED Discharge Orders         Ordered    metoCLOPramide (REGLAN) 5 MG tablet  3 times daily        02/28/20 1228              This chart was dictated using voice recognition software/Dragon. Despite best efforts to proofread, errors can occur which can change the meaning. Any change was purely unintentional.    Laban Emperor, PA-C 02/28/20 1547    Lavonia Drafts, MD 02/29/20 [phone]

## 2020-02-28 NOTE — ED Triage Notes (Signed)
Pt comes into the ED via POV c/o constant nausea and vomiting and is unable to keep fluids down.  Pt is [redacted] weeks pregnant at this time.  Denies any abdominal pain, vaginal bleeding, diarrhea, etc.  Pt in NAD at this time with even and unlabored respirations.

## 2020-03-06 ENCOUNTER — Ambulatory Visit (INDEPENDENT_AMBULATORY_CARE_PROVIDER_SITE_OTHER): Payer: 59 | Admitting: Obstetrics and Gynecology

## 2020-03-06 ENCOUNTER — Encounter: Payer: Self-pay | Admitting: Obstetrics and Gynecology

## 2020-03-06 ENCOUNTER — Other Ambulatory Visit: Payer: Self-pay

## 2020-03-06 ENCOUNTER — Other Ambulatory Visit: Payer: 59

## 2020-03-06 VITALS — BP 120/70 | Wt 176.0 lb

## 2020-03-06 DIAGNOSIS — O99810 Abnormal glucose complicating pregnancy: Secondary | ICD-10-CM

## 2020-03-06 DIAGNOSIS — O99211 Obesity complicating pregnancy, first trimester: Secondary | ICD-10-CM

## 2020-03-06 DIAGNOSIS — Z3A14 14 weeks gestation of pregnancy: Secondary | ICD-10-CM

## 2020-03-06 DIAGNOSIS — Z3492 Encounter for supervision of normal pregnancy, unspecified, second trimester: Secondary | ICD-10-CM

## 2020-03-06 NOTE — Progress Notes (Signed)
    Routine Prenatal Care Visit  Subjective  Rhonda Yates is a 34 y.o. G1P0 at [redacted]w[redacted]d being seen today for ongoing prenatal care.  She is currently monitored for the following issues for this low-risk pregnancy and has Supervision of low-risk pregnancy, first trimester; Abnormal glucose affecting pregnancy; and Obesity affecting pregnancy in first trimester on their problem list.  ----------------------------------------------------------------------------------- Patient reports N/V symptoms have improved since ER visit last week. Reports only three episodes of emesis since visit..    Krista Blue. Bleeding: None.   . Denies leaking of fluid.  ----------------------------------------------------------------------------------- The following portions of the patient's history were reviewed and updated as appropriate: allergies, current medications, past family history, past medical history, past social history, past surgical history and problem list. Problem list updated.   Objective  Blood pressure 120/70, weight 176 lb (79.8 kg), last menstrual period 11/13/2019. Pregravid weight 197 lb (89.4 kg) Total Weight Gain -21 lb (-9.526 kg) Urinalysis:      Fetal Status: Fetal Heart Rate (bpm): 150         General:  Alert, oriented and cooperative. Patient is in no acute distress.  Skin: Skin is warm and dry. No rash noted.   Cardiovascular: Normal heart rate noted  Respiratory: Normal respiratory effort, no problems with respiration noted  Abdomen: Soft, gravid, appropriate for gestational age. Pain/Pressure: Absent     Pelvic:  Cervical exam deferred        Extremities: Normal range of motion.  Edema: None  ental Status: Normal mood and affect. Normal behavior. Normal judgment and thought content.     Assessment   34 y.o. G1P0 at [redacted]w[redacted]d by  09/01/2020, by Ultrasound presenting for work-in prenatal visit for follow-up after ER visit for dehydration.  Plan      -Supervision of low=risk  pregnancy, second trimester -Abnormal glucose affecting pregnancy - 3h GTT today  -N/V in pregnancy - S&S improved per patient - continue prn Zofran    Gestational age appropriate obstetric precautions including but not limited to vaginal bleeding, contractions, leaking of fluid and fetal movement were reviewed in detail with the patient.    Keep previously scheduled appointments.  Orlie Pollen, CNM, MSN [facility], [facility] 03/06/2020, 8:58 AM

## 2020-03-07 LAB — GESTATIONAL GLUCOSE TOLERANCE
Glucose, Fasting: 74 mg/dL (ref 65–94)
Glucose, GTT - 1 Hour: 132 mg/dL (ref 65–179)
Glucose, GTT - 2 Hour: 109 mg/dL (ref 65–154)
Glucose, GTT - 3 Hour: 87 mg/dL (ref 65–139)

## 2020-03-17 ENCOUNTER — Other Ambulatory Visit: Payer: Self-pay | Admitting: Obstetrics & Gynecology

## 2020-03-20 ENCOUNTER — Other Ambulatory Visit: Payer: Self-pay

## 2020-03-20 ENCOUNTER — Encounter: Payer: Self-pay | Admitting: Advanced Practice Midwife

## 2020-03-20 ENCOUNTER — Ambulatory Visit (INDEPENDENT_AMBULATORY_CARE_PROVIDER_SITE_OTHER): Payer: 59 | Admitting: Advanced Practice Midwife

## 2020-03-20 VITALS — BP 122/74 | Wt 181.0 lb

## 2020-03-20 DIAGNOSIS — Z3492 Encounter for supervision of normal pregnancy, unspecified, second trimester: Secondary | ICD-10-CM

## 2020-03-20 DIAGNOSIS — Z3A16 16 weeks gestation of pregnancy: Secondary | ICD-10-CM

## 2020-03-20 NOTE — Progress Notes (Signed)
  Routine Prenatal Care Visit  Subjective  Rhonda Yates is a 34 y.o. G1P0 at [redacted]w[redacted]d being seen today for ongoing prenatal care.  She is currently monitored for the following issues for this low-risk pregnancy and has Supervision of low-risk pregnancy, second trimester; Abnormal glucose affecting pregnancy; and Obesity affecting pregnancy in first trimester on their problem list.  ----------------------------------------------------------------------------------- Patient reports feeling better and still taking zofran 2 times per day. She is interested in weaning off zofran.    . Vag. Bleeding: None.   . Leaking Fluid denies.  ----------------------------------------------------------------------------------- The following portions of the patient's history were reviewed and updated as appropriate: allergies, current medications, past family history, past medical history, past social history, past surgical history and problem list. Problem list updated.  Objective  Blood pressure 122/74, weight 181 lb (82.1 kg), last menstrual period 11/13/2019. Pregravid weight 197 lb (89.4 kg) Total Weight Gain -16 lb (-7.258 kg) Urinalysis: Urine Protein    Urine Glucose    Fetal Status: Fetal Heart Rate (bpm): 151         General:  Alert, oriented and cooperative. Patient is in no acute distress.  Skin: Skin is warm and dry. No rash noted.   Cardiovascular: Normal heart rate noted  Respiratory: Normal respiratory effort, no problems with respiration noted  Abdomen: Soft, gravid, appropriate for gestational age. Pain/Pressure: Absent     Pelvic:  Cervical exam deferred        Extremities: Normal range of motion.  Edema: None  Mental Status: Normal mood and affect. Normal behavior. Normal judgment and thought content.   Assessment   34 y.o. G1P0 at [redacted]w[redacted]d by  09/01/2020, by Ultrasound presenting for routine prenatal visit  Plan    Preterm labor symptoms and general obstetric precautions including  but not limited to vaginal bleeding, contractions, leaking of fluid and fetal movement were reviewed in detail with the patient.    Return for scheduled visit.  Rod Can, CNM 03/20/2020 11:05 AM

## 2020-03-20 NOTE — Progress Notes (Signed)
No vb. No lof.  

## 2020-03-29 NOTE — L&D Delivery Note (Signed)
Delivery Note At 12:46 AM a viable female was delivered via Vaginal, Spontaneous (Presentation: Left Occiput Anterior, right posterior nuchal arm reduced on perineum).  APGAR: 6, 8; weight 8 lb 1.1 oz (3660 g).   Placenta status: Spontaneous, Intact.  Cord: 3 vessels with the following complications: None.  Cord pH: N/A  Anesthesia: Epidural Episiotomy: None Lacerations: None Suture Repair: none Est. Blood Loss (mL): 327mL   Mom to postpartum.  Baby to Couplet care / Skin to Skin.  Malachy Mood 08/31/2020, 1:06 AM

## 2020-04-10 ENCOUNTER — Ambulatory Visit (INDEPENDENT_AMBULATORY_CARE_PROVIDER_SITE_OTHER): Payer: 59

## 2020-04-10 ENCOUNTER — Ambulatory Visit (INDEPENDENT_AMBULATORY_CARE_PROVIDER_SITE_OTHER): Payer: 59 | Admitting: Obstetrics and Gynecology

## 2020-04-10 ENCOUNTER — Other Ambulatory Visit: Payer: Self-pay

## 2020-04-10 VITALS — BP 120/68 | Wt 183.0 lb

## 2020-04-10 DIAGNOSIS — O99810 Abnormal glucose complicating pregnancy: Secondary | ICD-10-CM

## 2020-04-10 DIAGNOSIS — Z3492 Encounter for supervision of normal pregnancy, unspecified, second trimester: Secondary | ICD-10-CM

## 2020-04-10 DIAGNOSIS — Z3A19 19 weeks gestation of pregnancy: Secondary | ICD-10-CM

## 2020-04-10 DIAGNOSIS — O99211 Obesity complicating pregnancy, first trimester: Secondary | ICD-10-CM

## 2020-04-10 LAB — POCT URINALYSIS DIPSTICK OB
Glucose, UA: NEGATIVE
POC,PROTEIN,UA: NEGATIVE

## 2020-04-10 NOTE — Progress Notes (Addendum)
Routine Prenatal Care Visit  Subjective  Rhonda Yates is a 35 y.o. G1P0 at [redacted]w[redacted]d being seen today for ongoing prenatal care.  She is currently monitored for the following issues for this low-risk pregnancy and has Supervision of low-risk pregnancy, second trimester; Abnormal glucose affecting pregnancy; and Obesity affecting pregnancy in first trimester on their problem list.  ----------------------------------------------------------------------------------- Patient reports no complaints.   Contractions: Not present. Vag. Bleeding: None.  Movement: Absent. Denies leaking of fluid.  ----------------------------------------------------------------------------------- The following portions of the patient's history were reviewed and updated as appropriate: allergies, current medications, past family history, past medical history, past social history, past surgical history and problem list. Problem list updated.   Objective  Blood pressure 120/68, weight 183 lb (83 kg), last menstrual period 11/13/2019. Pregravid weight 197 lb (89.4 kg) Total Weight Gain -14 lb (-6.35 kg) Urinalysis:      Fetal Status: Fetal Heart Rate (bpm): 150   Movement: Absent     General:  Alert, oriented and cooperative. Patient is in no acute distress.  Skin: Skin is warm and dry. No rash noted.   Cardiovascular: Normal heart rate noted  Respiratory: Normal respiratory effort, no problems with respiration noted  Abdomen: Soft, gravid, appropriate for gestational age. Pain/Pressure: Absent     Pelvic:  Cervical exam deferred        Extremities: Normal range of motion.  Edema: None  ental Status: Normal mood and affect. Normal behavior. Normal judgment and thought content.   US OB Comp + 14 Wk  Result Date: 04/10/2020 Patient Name: DARNELL STIMSON DOB: 01-27-1986 MRN: [id number] ULTRASOUND REPORT Location: [facility] Date of Service: 04/10/2020 Indications:Anatomy Ultrasound Findings: Nelda Marseille  intrauterine pregnancy is visualized with FHR at 146 BPM. Biometrics give an (U/S) Gestational age of [redacted]w[redacted]d and an (U/S) EDD of 08/30/2020; this correlates with the clinically established Estimated Date of Delivery: 09/01/20 Fetal presentation is Breech. EFW: 321 g ( 11 oz ). Placenta: posterior. Grade: 0 AFI: subjectively normal. Anatomic survey is complete and normal; Gender - surprise.  Impression: 1. [redacted]w[redacted]d Viable Singleton Intrauterine pregnancy by U/S. 2. (U/S) EDD is consistent with Clinically established Estimated Date of Delivery: 09/01/20 . 3. Normal Anatomy Scan Recommendations: 1.Clinical correlation with the patient's History and Physical Exam. Gweneth Dimitri, RT  There is a singleton gestation with subjectively normal amniotic fluid volume. The fetal biometry correlates with established dating. Detailed evaluation of the fetal anatomy was performed.The fetal anatomical survey appears within normal limits within the resolution of ultrasound as described above.  It must be noted that a normal ultrasound is unable to rule out fetal aneuploidy, subtle defects such as small ASD or VDS may also not be visible on imaging.  Malachy Mood, MD, [facility], [facility] 04/10/2020, 9:13 AM    There is no immunization history on file for this patient.    Assessment   35 y.o. G1P0 at [redacted]w[redacted]d by  09/01/2020, by Ultrasound presenting for routine prenatal visit  Plan   pregnancy 1 Problems (from 11/13/19 to present)    Problem Noted Resolved   Supervision of low-risk pregnancy, second trimester 01/24/2020 by Gae Dry, MD No   Overview Addendum 03/06/2020  9:01 AM by Orlie Pollen, [place] Prenatal Labs  Dating By [place] at 8 weeks Blood type: B/Positive/-- (10/12 1141)   Genetic Screen      NIPS:declines Antibody:Negative (10/12 1141)  Anatomic [place] Complete Rubella: 3.98 (10/12 1141) Varicella:  Imm  GTT Early: 141 Early 3h: 74, 132, 109, 87 Third trimester:   RPR: Non Reactive (10/12 1141)   Rhogam n/a HBsAg: Negative (10/12 1141)   TDaP vaccine                       Flu Shot: HIV: Non Reactive (10/12 1141)   Baby Food                                GBS:   Contraception  Pap:10/21 nml  CBB     CS/VBAC    Support Person Husband [name]           Previous Version       Gestational age appropriate obstetric precautions including but not limited to vaginal bleeding, contractions, leaking of fluid and fetal movement were reviewed in detail with the patient.    Return in about 4 weeks (around 05/08/2020) for [name].  Malachy Mood, MD, [facility], [facility] 04/10/2020, 9:27 AM

## 2020-04-17 ENCOUNTER — Other Ambulatory Visit: Payer: Self-pay | Admitting: Obstetrics & Gynecology

## 2020-05-08 ENCOUNTER — Ambulatory Visit (INDEPENDENT_AMBULATORY_CARE_PROVIDER_SITE_OTHER): Payer: 59 | Admitting: Obstetrics

## 2020-05-08 ENCOUNTER — Other Ambulatory Visit: Payer: Self-pay

## 2020-05-08 VITALS — BP 122/74 | Wt 191.0 lb

## 2020-05-08 DIAGNOSIS — Z3492 Encounter for supervision of normal pregnancy, unspecified, second trimester: Secondary | ICD-10-CM

## 2020-05-08 DIAGNOSIS — Z3A23 23 weeks gestation of pregnancy: Secondary | ICD-10-CM

## 2020-05-08 DIAGNOSIS — Z3A28 28 weeks gestation of pregnancy: Secondary | ICD-10-CM

## 2020-05-08 NOTE — Progress Notes (Signed)
  Routine Prenatal Care Visit  Subjective  Rhonda Yates is a 35 y.o. G1P0 at [redacted]w[redacted]d being seen today for ongoing prenatal care.  She is currently monitored for the following issues for this low-risk pregnancy and has Supervision of low-risk pregnancy, second trimester; Abnormal glucose affecting pregnancy; and Obesity affecting pregnancy in first trimester on their problem list.  ----------------------------------------------------------------------------------- Patient reports no complaints.    .  .   Rhonda Yates Fluid denies.  ----------------------------------------------------------------------------------- The following portions of the patient's history were reviewed and updated as appropriate: allergies, current medications, past family history, past medical history, past social history, past surgical history and problem list. Problem list updated.  Objective  Blood pressure 122/74, weight 191 lb (86.6 kg), last menstrual period 11/13/2019. Pregravid weight 197 lb (89.4 kg) Total Weight Gain -6 lb (-2.722 kg) Urinalysis: Urine Protein    Urine Glucose    Fetal Status:           General:  Alert, oriented and cooperative. Patient is in no acute distress.  Skin: Skin is warm and dry. No rash noted.   Cardiovascular: Normal heart rate noted  Respiratory: Normal respiratory effort, no problems with respiration noted  Abdomen: Soft, gravid, appropriate for gestational age.       Pelvic:  Cervical exam deferred        Extremities: Normal range of motion.     Mental Status: Normal mood and affect. Normal behavior. Normal judgment and thought content.   Assessment   35 y.o. G1P0 at [redacted]w[redacted]d by  09/01/2020, by Ultrasound presenting for routine prenatal visit  Plan   pregnancy 1 Problems (from 11/13/19 to present)    Problem Noted Resolved   Supervision of low-risk pregnancy, second trimester 01/24/2020 by Gae Dry, MD No   Overview Addendum 04/10/2020  9:26 AM by Malachy Mood, MD    Clinic [facility] Prenatal Labs  Dating By [place] at 8 weeks Blood type: B/Positive/-- (10/12 1141)   Genetic Screen      NIPS:declines Antibody:Negative (10/12 1141)  Anatomic [place] Complete 04/10/20 Rubella: 3.98 (10/12 1141) Varicella: Imm  GTT Early: 141 Early 3h: 74, 132, 109, 61 Third trimester:  RPR: Non Reactive (10/12 1141)   Rhogam n/a HBsAg: Negative (10/12 1141)   TDaP vaccine                       Flu Shot: HIV: Non Reactive (10/12 1141)   Baby Food                                GBS:   Contraception  Pap:10/21 nml  CBB     CS/VBAC    Support Person Husband [name]           Previous Version       Preterm labor symptoms and general obstetric precautions including but not limited to vaginal bleeding, contractions, leaking of fluid and fetal movement were reviewed in detail with the patient. Please refer to After Visit Summary for other counseling recommendations.  Discussed CBE education. Asking about water for labor and birth. Shared that Rhonda Yates does not offer water birth. Encouraged her to take a CBE class virtually.  28 week labs next visit  Return in about 2 weeks (around 05/22/2020) for return OB.  Rhonda Yates, CNM  05/08/2020 4:24 PM

## 2020-05-08 NOTE — Progress Notes (Signed)
No vb. No lof.  

## 2020-06-05 ENCOUNTER — Ambulatory Visit (INDEPENDENT_AMBULATORY_CARE_PROVIDER_SITE_OTHER): Payer: 59 | Admitting: Obstetrics and Gynecology

## 2020-06-05 ENCOUNTER — Telehealth: Payer: Self-pay

## 2020-06-05 ENCOUNTER — Other Ambulatory Visit: Payer: Self-pay

## 2020-06-05 ENCOUNTER — Encounter: Payer: Self-pay | Admitting: Obstetrics and Gynecology

## 2020-06-05 VITALS — BP 118/70 | Ht 62.0 in | Wt 194.8 lb

## 2020-06-05 DIAGNOSIS — Z113 Encounter for screening for infections with a predominantly sexual mode of transmission: Secondary | ICD-10-CM

## 2020-06-05 DIAGNOSIS — Z3A37 37 weeks gestation of pregnancy: Secondary | ICD-10-CM

## 2020-06-05 DIAGNOSIS — Z131 Encounter for screening for diabetes mellitus: Secondary | ICD-10-CM

## 2020-06-05 DIAGNOSIS — Z3492 Encounter for supervision of normal pregnancy, unspecified, second trimester: Secondary | ICD-10-CM

## 2020-06-05 LAB — POCT URINALYSIS DIPSTICK OB
Glucose, UA: NEGATIVE
POC,PROTEIN,UA: NEGATIVE

## 2020-06-05 NOTE — Telephone Encounter (Signed)
Patient made an a payment for 312.68  4 x left

## 2020-06-05 NOTE — Patient Instructions (Signed)
How bad does labor hurt? Labor and birth are different for each person. The amount of pain and where you feel pain the most changes during labor. Even if you have had a baby before, labor pain can be different with each baby. Nobody can know ahead of time how difficult or painful their labor will be.  Why does labor hurt? During labor, your uterus (womb) pushes your baby down and stretches your cervix (the opening of your uterus). When your uterus has a contraction (muscles get tight), you feel pain that is like a strong cramp in your abdomen or lower back. Labor pain usually feels like the cramps you feel during your period. As labor goes on and the cervix gets more stretched, the cramping pain usually gets worse. Most contractions last 30 to 60 seconds, and you will be able to rest in between each one.  How can I decide before labor starts what will be the best way for me to cope with labor pain? Take childbirth classes. The more you know, the less you fear. Being afraid and worried makes pain hurt more. Knowing what to expect will help you cope better with labor pain. Learn about the different medication and non-medication options for coping with pain that are used where you are going to give birth.  What can I do to prepare for labor? Stay physically active during pregnancy. You will have more strength to get through labor. Also, women who are in good physical shape often have shorter labors. Find a support person or doula to be with you during labor. Having a person whose only job is to be with you and support you during labor will help you cope better. Talk with your support persons and caregivers about your ideas for what will help you cope with labor pain so that you can trust them to help you make decisions that are right for you when you are in labor.  What can I do to cope during early labor? Stay up and out of bed. Walking and being on your feet can help your contractions work better but  feel less painful. Do something you enjoy. This will help you keep your mind off the pain. Drink lots of fluids so you don't get dehydrated. Eat lightly if you are hungry. Take a warm shower or bath. Water often makes your contractions easier to handle.  What can I do to cope during active labor? Women who cope well during labor rock or move and sometimes make a noise like groaning during contractions. Then they rest between contractions. Going back and forth between moving and resting in a regular rhythm helps them cope with labor as it progresses. Each person has their own rhythm that works. Here are some ideas to help you find your rhythm during labor:  Between contractions Rest by being still or by rocking gently. Focus on your breathing in and breathing out. When you pay close attention to your breathing, your body can relax. Relax your muscles, especially your shoulders and toes. Those muscles often get tight during contractions. Move or rock your hips to relax those muscles. Change positions often. Listen to music that soothes you. This may help you relax and keep your mind off the pain. During contractions Get in a tub or shower. Water can help labor feel less painful. Make noise. You can moan, hum, or repeat comforting words over and over as you go through each contraction. Some women find different breathing patterns taught in childbirth   classes helpful. Move back and forth or rock your hips in whatever way helps you cope as the contraction gets more painful and then gets less painful as it goes away.  What can my support person do to help me cope with labor pain? Help you find your rhythm and then help you keep making the same noise and movements during a contraction and doing the same relaxing things between contractions. Hold your hand quietly. Touch or rub your back, hands, or feet if being touched is something you like. Offer you juice, water, or ice chips between  contractions. Help you change positions and support your body. Make the room you are in comforting. Keep the lights low, play soft music, and have something to look at that relaxes you or makes you happy. Put a cold washcloth on your forehead or neck. Put a heating pad or warm washcloth on your lower back. Talk to you and remind you that you are strong.  What can my health care provider do to help me cope with labor pain? Answer your questions. Check your progress and give you information about how the labor is going. Provide information about different ways to help you cope with labor pain.   For More Information [facility] Connection: Comfort in Labor https://www.nationalpartnership.org/our-work/resources/health-care/maternity/comfort-in-labor-simkin.pdf  [facility]birth Connection: Labor Pain http://www.childbirthconnection.org/giving-birth/labor-pain/  [facility]: Coping with Labor Pain without Using Medicines https://my.[facility].org/health/articles/15586-labor-without-medication-coping-skills  [facility]: Choosing How You Want to Cope with Labor Pain https://www.[facility].org/healthy-lifestyle/labor-and-delivery/in-depth/labor-pain/art-20044845  [facility]: Medicines for Labor Pain https://www.[facility].org/healthy-lifestyle/labor-and-delivery/in-depth/labor-and-delivery/art-20049326  Penny Simkin: Relaxation, Rhythm, and Ritual: The 3 Rs of Childbirth https://www.pennysimkin.com/three-rs/  

## 2020-06-05 NOTE — Progress Notes (Signed)
Routine Prenatal Care Visit  Subjective  Rhonda Yates is a 35 y.o. G1P0 at [redacted]w[redacted]d being seen today for ongoing prenatal care.  She is currently monitored for the following issues for this low-risk pregnancy and has Supervision of low-risk pregnancy, second trimester; Abnormal glucose affecting pregnancy; and Obesity affecting pregnancy in first trimester on their problem list.  ----------------------------------------------------------------------------------- Patient reports no complaints.   Contractions: Not present. Vag. Bleeding: None.  Movement: Present. Denies leaking of fluid.  ----------------------------------------------------------------------------------- The following portions of the patient's history were reviewed and updated as appropriate: allergies, current medications, past family history, past medical history, past social history, past surgical history and problem list. Problem list updated.   Objective  Blood pressure 118/70, height 5\' 2"  (1.575 m), weight 194 lb 12.8 oz (88.4 kg), last menstrual period 11/13/2019. Pregravid weight 197 lb (89.4 kg) Total Weight Gain -2 lb 3.2 oz (-0.998 kg) Urinalysis:      Fetal Status: Fetal Heart Rate (bpm): 140 Fundal Height: 28 cm Movement: Present     General:  Alert, oriented and cooperative. Patient is in no acute distress.  Skin: Skin is warm and dry. No rash noted.   Cardiovascular: Normal heart rate noted  Respiratory: Normal respiratory effort, no problems with respiration noted  Abdomen: Soft, gravid, appropriate for gestational age. Pain/Pressure: Absent     Pelvic:  Cervical exam deferred        Extremities: Normal range of motion.  Edema: None  ental Status: Normal mood and affect. Normal behavior. Normal judgment and thought content.     Assessment   35 y.o. G1P0 at [redacted]w[redacted]d by  09/01/2020, by Ultrasound presenting for routine prenatal visit  Plan   pregnancy 1 Problems (from 11/13/19 to present)     Problem Noted Resolved   Supervision of low-risk pregnancy, second trimester 01/24/2020 by Gae Dry, MD No   Overview Addendum 06/05/2020  8:34 AM by Orlie Pollen, CNM     Nursing Staff Provider  Office Location  [facility] Dating   8wk [place]  Language  English Anatomy US   complete  Flu Vaccine    Genetic Screen  NIPS: declined   TDaP vaccine    Hgb A1C or  GTT Early: 141 Early 3h: 74, 132, 109, 35 Third trimester:   Rhogam   n/a   LAB RESULTS   Feeding Plan  breast Blood Type B/Positive/-- (10/12 1141)   Contraception  POP Antibody Negative (10/12 1141)  Circumcision  Rubella 3.98 (10/12 1141)  Pediatrician   RPR Non Reactive (10/12 1141)   Support Person  husband "[name]" HBsAg Negative (10/12 1141)   Prenatal Classes  information provided HIV Non Reactive (10/12 1141)    Varicella  immune  BTL Consent  n/a GBS  (For PCN allergy, check sensitivities)        VBAC Consent  n/a Pap  10/21 NILM    Hgb Electro   n/a    CF      SMA               Previous Version      -1h GTT today with third trimester labs -Discussed TWG in pregnancy (-2lb) - patient states she lost a significant amount of weight early and feels she has a good appetite now -Reviewed information on childbirth education - patient desires unmedicated delivery, resources provided in AVS  Preterm labor precautions including but not limited to vaginal bleeding, contractions, leaking of fluid and fetal movement were reviewed in  detail with the patient.    Return in about 3 weeks (around 06/26/2020) for [name].  Orlie Pollen, CNM, MSN [facility], [facility] 06/05/2020, 8:37 AM

## 2020-06-07 LAB — 28 WEEK RH+PANEL
Basophils Absolute: 0 10*3/uL (ref 0.0–0.2)
Basos: 0 %
EOS (ABSOLUTE): 0.1 10*3/uL (ref 0.0–0.4)
Eos: 1 %
Gestational Diabetes Screen: 173 mg/dL — ABNORMAL HIGH (ref 65–139)
HIV Screen 4th Generation wRfx: NONREACTIVE
Hematocrit: 38.4 % (ref 34.0–46.6)
Hemoglobin: 12.8 g/dL (ref 11.1–15.9)
Immature Grans (Abs): 0 10*3/uL (ref 0.0–0.1)
Immature Granulocytes: 0 %
Lymphocytes Absolute: 1.7 10*3/uL (ref 0.7–3.1)
Lymphs: 18 %
MCH: 30.5 pg (ref 26.6–33.0)
MCHC: 33.3 g/dL (ref 31.5–35.7)
MCV: 92 fL (ref 79–97)
Monocytes Absolute: 0.3 10*3/uL (ref 0.1–0.9)
Monocytes: 3 %
Neutrophils Absolute: 7.7 10*3/uL — ABNORMAL HIGH (ref 1.4–7.0)
Neutrophils: 78 %
Platelets: 282 10*3/uL (ref 150–450)
RBC: 4.19 x10E6/uL (ref 3.77–5.28)
RDW: 11.6 % — ABNORMAL LOW (ref 11.7–15.4)
RPR Ser Ql: NONREACTIVE
WBC: 9.9 10*3/uL (ref 3.4–10.8)

## 2020-06-09 ENCOUNTER — Other Ambulatory Visit: Payer: Self-pay | Admitting: Obstetrics and Gynecology

## 2020-06-09 DIAGNOSIS — O99810 Abnormal glucose complicating pregnancy: Secondary | ICD-10-CM

## 2020-06-12 ENCOUNTER — Other Ambulatory Visit: Payer: 59

## 2020-06-12 ENCOUNTER — Other Ambulatory Visit: Payer: Self-pay

## 2020-06-12 DIAGNOSIS — O99810 Abnormal glucose complicating pregnancy: Secondary | ICD-10-CM

## 2020-06-13 LAB — GESTATIONAL GLUCOSE TOLERANCE
Glucose, Fasting: 69 mg/dL (ref 65–94)
Glucose, GTT - 1 Hour: 186 mg/dL — ABNORMAL HIGH (ref 65–179)
Glucose, GTT - 2 Hour: 126 mg/dL (ref 65–154)
Glucose, GTT - 3 Hour: 99 mg/dL (ref 65–139)

## 2020-06-26 ENCOUNTER — Encounter: Payer: Self-pay | Admitting: Advanced Practice Midwife

## 2020-06-26 ENCOUNTER — Ambulatory Visit (INDEPENDENT_AMBULATORY_CARE_PROVIDER_SITE_OTHER): Payer: 59 | Admitting: Advanced Practice Midwife

## 2020-06-26 ENCOUNTER — Other Ambulatory Visit: Payer: Self-pay

## 2020-06-26 VITALS — BP 118/76 | Wt 196.0 lb

## 2020-06-26 DIAGNOSIS — Z3403 Encounter for supervision of normal first pregnancy, third trimester: Secondary | ICD-10-CM

## 2020-06-26 DIAGNOSIS — Z3A30 30 weeks gestation of pregnancy: Secondary | ICD-10-CM

## 2020-06-26 NOTE — Patient Instructions (Signed)

## 2020-06-26 NOTE — Progress Notes (Signed)
  Routine Prenatal Care Visit  Subjective  Rhonda Yates is a 35 y.o. G1P0 at [redacted]w[redacted]d being seen today for ongoing prenatal care.  She is currently monitored for the following issues for this low-risk pregnancy and has Supervision of low-risk pregnancy, second trimester; Abnormal glucose affecting pregnancy; and Obesity affecting pregnancy in first trimester on their problem list.  ----------------------------------------------------------------------------------- Patient reports no complaints.   Contractions: Not present. Vag. Bleeding: None.  Movement: Present. Leaking Fluid denies.  ----------------------------------------------------------------------------------- The following portions of the patient's history were reviewed and updated as appropriate: allergies, current medications, past family history, past medical history, past social history, past surgical history and problem list. Problem list updated.  Objective  Blood pressure 118/76, weight 196 lb (88.9 kg), last menstrual period 11/13/2019. Pregravid weight 197 lb (89.4 kg) Total Weight Gain -1 lb (-0.454 kg) Urinalysis: Urine Protein    Urine Glucose    Fetal Status: Fetal Heart Rate (bpm): 136 Fundal Height: 31 cm Movement: Present     General:  Alert, oriented and cooperative. Patient is in no acute distress.  Skin: Skin is warm and dry. No rash noted.   Cardiovascular: Normal heart rate noted  Respiratory: Normal respiratory effort, no problems with respiration noted  Abdomen: Soft, gravid, appropriate for gestational age. Pain/Pressure: Absent     Pelvic:  Cervical exam deferred        Extremities: Normal range of motion.     Mental Status: Normal mood and affect. Normal behavior. Normal judgment and thought content.   Assessment   35 y.o. G1P0 at [redacted]w[redacted]d by  09/01/2020, by Ultrasound presenting for routine prenatal visit  Plan   pregnancy 1 Problems (from 11/13/19 to present)    Problem Noted Resolved    Supervision of low-risk pregnancy, second trimester 01/24/2020 by Gae Dry, MD No   Overview Addendum 06/05/2020  8:34 AM by Orlie Pollen, CNM     Nursing Staff Provider  Office Location  [facility] Dating   8wk [place]  Language  English Anatomy US   complete  Flu Vaccine    Genetic Screen  NIPS: declined   TDaP vaccine    Hgb A1C or  GTT Early: 141 Early 3h: 74, 132, 109, 11 Third trimester:   Rhogam   n/a   LAB RESULTS   Feeding Plan  breast Blood Type B/Positive/-- (10/12 1141)   Contraception  POP Antibody Negative (10/12 1141)  Circumcision  Rubella 3.98 (10/12 1141)  Pediatrician   RPR Non Reactive (10/12 1141)   Support Person  husband "[name]" HBsAg Negative (10/12 1141)   Prenatal Classes  information provided HIV Non Reactive (10/12 1141)    Varicella  immune  BTL Consent  n/a GBS  (For PCN allergy, check sensitivities)        VBAC Consent  n/a Pap  10/21 NILM    Hgb Electro   n/a    CF      SMA               Previous Version       Preterm labor symptoms and general obstetric precautions including but not limited to vaginal bleeding, contractions, leaking of fluid and fetal movement were reviewed in detail with the patient. Please refer to After Visit Summary for other counseling recommendations.   Return in about 2 weeks (around 07/10/2020) for rob.  Rod Can, CNM 06/26/2020 8:35 AM

## 2020-06-26 NOTE — Progress Notes (Signed)
No vb. No lof.  

## 2020-07-10 ENCOUNTER — Encounter: Payer: Self-pay | Admitting: Obstetrics and Gynecology

## 2020-07-10 ENCOUNTER — Other Ambulatory Visit: Payer: Self-pay

## 2020-07-10 ENCOUNTER — Ambulatory Visit (INDEPENDENT_AMBULATORY_CARE_PROVIDER_SITE_OTHER): Payer: 59 | Admitting: Obstetrics and Gynecology

## 2020-07-10 VITALS — BP 110/80 | Wt 198.0 lb

## 2020-07-10 DIAGNOSIS — Z3492 Encounter for supervision of normal pregnancy, unspecified, second trimester: Secondary | ICD-10-CM

## 2020-07-10 DIAGNOSIS — O99211 Obesity complicating pregnancy, first trimester: Secondary | ICD-10-CM

## 2020-07-10 LAB — POCT URINALYSIS DIPSTICK OB
Glucose, UA: NEGATIVE
POC,PROTEIN,UA: NEGATIVE

## 2020-07-10 NOTE — Progress Notes (Signed)
Routine Prenatal Care Visit  Subjective  Rhonda Yates is a 35 y.o. G1P0 at [redacted]w[redacted]d being seen today for ongoing prenatal care.  She is currently monitored for the following issues for this low-risk pregnancy and has Supervision of low-risk pregnancy, second trimester; Abnormal glucose affecting pregnancy; and Obesity affecting pregnancy in first trimester on their problem list.  ----------------------------------------------------------------------------------- Patient reports no complaints.   Contractions: Not present. Vag. Bleeding: None.  Movement: Present. Leaking Fluid denies.  ----------------------------------------------------------------------------------- The following portions of the patient's history were reviewed and updated as appropriate: allergies, current medications, past family history, past medical history, past social history, past surgical history and problem list. Problem list updated.  Objective  Blood pressure 110/80, weight 198 lb (89.8 kg), last menstrual period 11/13/2019. Pregravid weight 197 lb (89.4 kg) Total Weight Gain 1 lb (0.454 kg) Urinalysis: Urine Protein Negative  Urine Glucose Negative  Fetal Status: Fetal Heart Rate (bpm): 145 Fundal Height: 31 cm Movement: Present     General:  Alert, oriented and cooperative. Patient is in no acute distress.  Skin: Skin is warm and dry. No rash noted.   Cardiovascular: Normal heart rate noted  Respiratory: Normal respiratory effort, no problems with respiration noted  Abdomen: Soft, gravid, appropriate for gestational age. Pain/Pressure: Absent     Pelvic:  Cervical exam deferred        Extremities: Normal range of motion.  Edema: None  Mental Status: Normal mood and affect. Normal behavior. Normal judgment and thought content.   Assessment   35 y.o. G1P0 at [redacted]w[redacted]d by  09/01/2020, by Ultrasound presenting for routine prenatal visit  Plan   pregnancy 1 Problems (from 11/13/19 to present)    Problem Noted  Resolved   Supervision of low-risk pregnancy, second trimester 01/24/2020 by Gae Dry, MD No   Overview Addendum 07/10/2020  8:23 AM by Will Bonnet, MD     Nursing Staff Provider  Office Location  [facility] Dating   8wk [place]  Language  English Anatomy US   complete  Flu Vaccine    Genetic Screen  NIPS: declined   TDaP vaccine    Hgb A1C or  GTT Early: 141 Early 3h: 74, 132, 109, 87 Third trimester: passed 3 h  Rhogam   n/a   LAB RESULTS   Feeding Plan  breast Blood Type B/Positive/-- (10/12 1141)   Contraception  POP Antibody Negative (10/12 1141)  Circumcision  Rubella 3.98 (10/12 1141)  Pediatrician   RPR Non Reactive (10/12 1141)   Support Person  husband "[name]" HBsAg Negative (10/12 1141)   Prenatal Classes  information provided HIV Non Reactive (10/12 1141)    Varicella  immune  BTL Consent  n/a GBS  (For PCN allergy, check sensitivities)        VBAC Consent  n/a Pap  10/21 NILM    Hgb Electro   n/a    CF      SMA               Previous Version       Preterm labor symptoms and general obstetric precautions including but not limited to vaginal bleeding, contractions, leaking of fluid and fetal movement were reviewed in detail with the patient. Please refer to After Visit Summary for other counseling recommendations.   - elects to have TDaP at next visit  Return in about 2 weeks (around 07/24/2020) for Routine Prenatal Appointment.   Prentice Docker, MD, [facility], [facility] 07/10/2020 8:36  AM

## 2020-07-24 ENCOUNTER — Ambulatory Visit (INDEPENDENT_AMBULATORY_CARE_PROVIDER_SITE_OTHER): Payer: 59 | Admitting: Obstetrics

## 2020-07-24 ENCOUNTER — Other Ambulatory Visit: Payer: Self-pay

## 2020-07-24 VITALS — BP 130/66 | Wt 207.0 lb

## 2020-07-24 DIAGNOSIS — Z23 Encounter for immunization: Secondary | ICD-10-CM | POA: Diagnosis not present

## 2020-07-24 DIAGNOSIS — Z3403 Encounter for supervision of normal first pregnancy, third trimester: Secondary | ICD-10-CM

## 2020-07-24 DIAGNOSIS — Z3A34 34 weeks gestation of pregnancy: Secondary | ICD-10-CM

## 2020-07-24 LAB — POCT URINALYSIS DIPSTICK OB
Glucose, UA: NEGATIVE
POC,PROTEIN,UA: NEGATIVE

## 2020-07-24 NOTE — Progress Notes (Signed)
TDAP/34 wk instructions/FKC's given today. No complaints

## 2020-07-24 NOTE — Progress Notes (Signed)
Routine Prenatal Care Visit  Subjective  Rhonda Yates is a 35 y.o. G1P0 at [redacted]w[redacted]d being seen today for ongoing prenatal care.  She is currently monitored for the following issues for this low-risk pregnancy and has Supervision of low-risk pregnancy, second trimester; Abnormal glucose affecting pregnancy; and Obesity affecting pregnancy in first trimester on their problem list.  ----------------------------------------------------------------------------------- Patient reports no complaints.   Contractions: Irritability. Vag. Bleeding: None.  Movement: Present. Leaking Fluid denies.  ----------------------------------------------------------------------------------- The following portions of the patient's history were reviewed and updated as appropriate: allergies, current medications, past family history, past medical history, past social history, past surgical history and problem list. Problem list updated.  Objective  Blood pressure (!) 148/68, weight 207 lb (93.9 kg), last menstrual period 11/13/2019. Pregravid weight 197 lb (89.4 kg) Total Weight Gain 10 lb (4.536 kg) Urinalysis: Urine Protein    Urine Glucose    Fetal Status:     Movement: Present     General:  Alert, oriented and cooperative. Patient is in no acute distress.  Skin: Skin is warm and dry. No rash noted.   Cardiovascular: Normal heart rate noted  Respiratory: Normal respiratory effort, no problems with respiration noted  Abdomen: Soft, gravid, appropriate for gestational age. Pain/Pressure: Absent     Pelvic:  Cervical exam deferred        Extremities: Normal range of motion.     Mental Status: Normal mood and affect. Normal behavior. Normal judgment and thought content.   Assessment   35 y.o. G1P0 at [redacted]w[redacted]d by  09/01/2020, by Ultrasound presenting for routine prenatal visit One elevated blood pressure reading. BP repeated- 130/66  Plan   pregnancy 1 Problems (from 11/13/19 to present)    Problem Noted  Resolved   Supervision of low-risk pregnancy, second trimester 01/24/2020 by Gae Dry, MD No   Overview Addendum 07/10/2020  8:23 AM by Will Bonnet, MD     Nursing Staff Provider  Office Location  [facility] Dating   8wk [place]  Language  English Anatomy US   complete  Flu Vaccine    Genetic Screen  NIPS: declined   TDaP vaccine    Hgb A1C or  GTT Early: 141 Early 3h: 74, 132, 109, 87 Third trimester: passed 3 h  Rhogam   n/a   LAB RESULTS   Feeding Plan  breast Blood Type B/Positive/-- (10/12 1141)   Contraception  POP Antibody Negative (10/12 1141)  Circumcision  Rubella 3.98 (10/12 1141)  Pediatrician   RPR Non Reactive (10/12 1141)   Support Person  husband "[name]" HBsAg Negative (10/12 1141)   Prenatal Classes  information provided HIV Non Reactive (10/12 1141)    Varicella  immune  BTL Consent  n/a GBS  (For PCN allergy, check sensitivities)        VBAC Consent  n/a Pap  10/21 NILM    Hgb Electro   n/a    CF      SMA               Previous Version       Preterm labor symptoms and general obstetric precautions including but not limited to vaginal bleeding, contractions, leaking of fluid and fetal movement were reviewed in detail with the patient. Please refer to After Visit Summary for other counseling recommendations.  Tdap given today. BP repeated- 130/66 Pre eclampsia precautions provided Pt has no preference for delivering provider when asked today.  No follow-ups on file.  Imagene Riches, CNM  07/24/2020 8:52 AM

## 2020-07-24 NOTE — Addendum Note (Signed)
Addended by: Meryl Dare on: 07/24/2020 09:13 AM   Modules accepted: Orders

## 2020-07-31 ENCOUNTER — Ambulatory Visit (INDEPENDENT_AMBULATORY_CARE_PROVIDER_SITE_OTHER): Payer: 59 | Admitting: Advanced Practice Midwife

## 2020-07-31 ENCOUNTER — Other Ambulatory Visit: Payer: Self-pay

## 2020-07-31 ENCOUNTER — Encounter: Payer: 59 | Admitting: Advanced Practice Midwife

## 2020-07-31 ENCOUNTER — Encounter: Payer: Self-pay | Admitting: Advanced Practice Midwife

## 2020-07-31 VITALS — BP 124/78 | Wt 207.0 lb

## 2020-07-31 DIAGNOSIS — Z3A35 35 weeks gestation of pregnancy: Secondary | ICD-10-CM

## 2020-07-31 DIAGNOSIS — Z3403 Encounter for supervision of normal first pregnancy, third trimester: Secondary | ICD-10-CM

## 2020-07-31 NOTE — Progress Notes (Signed)
  Routine Prenatal Care Visit  Subjective  Rhonda Yates is a 35 y.o. G1P0 at [redacted]w[redacted]d being seen today for ongoing prenatal care.  She is currently monitored for the following issues for this low-risk pregnancy and has Supervision of low-risk pregnancy, second trimester; Abnormal glucose affecting pregnancy; and Obesity affecting pregnancy in first trimester on their problem list.  ----------------------------------------------------------------------------------- Patient reports no complaints.  She had an elevated BP at her last visit. She denies headache, visual changes or epigastric pain and BP is normal today. Contractions: Not present. Vag. Bleeding: None.  Movement: Present. Leaking Fluid denies.  ----------------------------------------------------------------------------------- The following portions of the patient's history were reviewed and updated as appropriate: allergies, current medications, past family history, past medical history, past social history, past surgical history and problem list. Problem list updated.  Objective  Blood pressure 124/78, weight 207 lb (93.9 kg), last menstrual period 11/13/2019. Pregravid weight 197 lb (89.4 kg) Total Weight Gain 10 lb (4.536 kg) Urinalysis: Urine Protein    Urine Glucose    Fetal Status: Fetal Heart Rate (bpm): 148 Fundal Height: 36 cm Movement: Present     General:  Alert, oriented and cooperative. Patient is in no acute distress.  Skin: Skin is warm and dry. No rash noted.   Cardiovascular: Normal heart rate noted  Respiratory: Normal respiratory effort, no problems with respiration noted  Abdomen: Soft, gravid, appropriate for gestational age. Pain/Pressure: Absent     Pelvic:  Cervical exam deferred        Extremities: Normal range of motion.  Edema: None  Mental Status: Normal mood and affect. Normal behavior. Normal judgment and thought content.   Assessment   35 y.o. G1P0 at [redacted]w[redacted]d by  09/01/2020, by Ultrasound  presenting for routine prenatal visit  Plan   pregnancy 1 Problems (from 11/13/19 to present)    Problem Noted Resolved   Supervision of low-risk pregnancy, second trimester 01/24/2020 by Gae Dry, MD No   Overview Addendum 07/10/2020  8:23 AM by Will Bonnet, MD     Nursing Staff Provider  Office Location  [facility] Dating   8wk [place]  Language  English Anatomy US   complete  Flu Vaccine    Genetic Screen  NIPS: declined   TDaP vaccine    Hgb A1C or  GTT Early: 141 Early 3h: 74, 132, 109, 87 Third trimester: passed 3 h  Rhogam   n/a   LAB RESULTS   Feeding Plan  breast Blood Type B/Positive/-- (10/12 1141)   Contraception  POP Antibody Negative (10/12 1141)  Circumcision  Rubella 3.98 (10/12 1141)  Pediatrician   RPR Non Reactive (10/12 1141)   Support Person  husband "[name]" HBsAg Negative (10/12 1141)   Prenatal Classes  information provided HIV Non Reactive (10/12 1141)    Varicella  immune  BTL Consent  n/a GBS  (For PCN allergy, check sensitivities)        VBAC Consent  n/a Pap  10/21 NILM    Hgb Electro   n/a    CF      SMA               Previous Version       Preterm labor symptoms and general obstetric precautions including but not limited to vaginal bleeding, contractions, leaking of fluid and fetal movement were reviewed in detail with the patient.   Return in about 1 week (around 08/07/2020) for rob  Rod Can, CNM 07/31/2020 11:23 AM

## 2020-08-08 ENCOUNTER — Other Ambulatory Visit: Payer: Self-pay

## 2020-08-08 ENCOUNTER — Encounter: Payer: Self-pay | Admitting: Obstetrics and Gynecology

## 2020-08-08 ENCOUNTER — Other Ambulatory Visit (HOSPITAL_COMMUNITY)
Admission: RE | Admit: 2020-08-08 | Discharge: 2020-08-08 | Disposition: A | Discharge status: Home / Self Care | Payer: 59 | Source: Ambulatory Visit | Attending: Obstetrics and Gynecology | Admitting: Obstetrics and Gynecology

## 2020-08-08 ENCOUNTER — Ambulatory Visit (INDEPENDENT_AMBULATORY_CARE_PROVIDER_SITE_OTHER): Payer: 59 | Admitting: Obstetrics and Gynecology

## 2020-08-08 VITALS — BP 120/60 | Wt 213.0 lb

## 2020-08-08 DIAGNOSIS — Z113 Encounter for screening for infections with a predominantly sexual mode of transmission: Secondary | ICD-10-CM | POA: Insufficient documentation

## 2020-08-08 DIAGNOSIS — Z3493 Encounter for supervision of normal pregnancy, unspecified, third trimester: Secondary | ICD-10-CM

## 2020-08-08 DIAGNOSIS — Z3685 Encounter for antenatal screening for Streptococcus B: Secondary | ICD-10-CM

## 2020-08-08 DIAGNOSIS — Z3A36 36 weeks gestation of pregnancy: Secondary | ICD-10-CM

## 2020-08-08 LAB — POCT URINALYSIS DIPSTICK OB
Glucose, UA: NEGATIVE
POC,PROTEIN,UA: NEGATIVE

## 2020-08-08 NOTE — Progress Notes (Signed)
Routine Prenatal Care Visit  Subjective  Rhonda Yates is a 35 y.o. G1P0 at [redacted]w[redacted]d being seen today for ongoing prenatal care.  She is currently monitored for the following issues for this low-risk pregnancy and has Supervision of low-risk pregnancy, third trimester; Abnormal glucose affecting pregnancy; and Obesity affecting pregnancy in first trimester on their problem list.  ----------------------------------------------------------------------------------- Patient reports swelling in feet. Patient reports active job where she walks consistently..   Contractions: Irritability. Vag. Bleeding: None.  Movement: Present. Denies leaking of fluid.  ----------------------------------------------------------------------------------- The following portions of the patient's history were reviewed and updated as appropriate: allergies, current medications, past family history, past medical history, past social history, past surgical history and problem list. Problem list updated.   Objective  Blood pressure 120/60, weight 213 lb (96.6 kg), last menstrual period 11/13/2019. Pregravid weight 197 lb (89.4 kg) Total Weight Gain 16 lb (7.258 kg) Urinalysis:      Fetal Status: Fetal Heart Rate (bpm): 155 Fundal Height: 37 cm Movement: Present  Presentation: Vertex  General:  Alert, oriented and cooperative. Patient is in no acute distress.  Skin: Skin is warm and dry. No rash noted.   Cardiovascular: Normal heart rate noted  Respiratory: Normal respiratory effort, no problems with respiration noted  Abdomen: Soft, gravid, appropriate for gestational age. Pain/Pressure: Absent     Pelvic:  Cervical exam performed Dilation: Closed Effacement (%): Thick Station: -3  Extremities: Normal range of motion.  Edema: Mild pitting, slight indentation  ental Status: Normal mood and affect. Normal behavior. Normal judgment and thought content.     Assessment   35 y.o. G1P0 at [redacted]w[redacted]d by  09/01/2020, by  Ultrasound presenting for routine prenatal visit  Plan   pregnancy 1 Problems (from 11/13/19 to present)    Problem Noted Resolved   Supervision of low-risk pregnancy, third trimester 01/24/2020 by Gae Dry, MD No   Overview Addendum 08/08/2020  1:34 PM by Orlie Pollen, CNM     Nursing Staff Provider  Office Location  [facility] Dating   8wk [place]  Language  English Anatomy US   complete  Flu Vaccine    Genetic Screen  NIPS: declined   TDaP vaccine   07/24/20 Hgb A1C or  GTT Early: 141 Early 3h: 74, 132, 109, 87 Third trimester: passed 3 h  Rhogam   n/a   LAB RESULTS   Feeding Plan  breast Blood Type B/Positive/-- (10/12 1141)   Contraception  POP Antibody Negative (10/12 1141)  Circumcision  Rubella 3.98 (10/12 1141)  Pediatrician   RPR Non Reactive (10/12 1141)   Support Person  husband "[name]" HBsAg Negative (10/12 1141)   Prenatal Classes  information provided HIV Non Reactive (10/12 1141)    Varicella  immune  BTL Consent  n/a GBS  (For PCN allergy, check sensitivities)        VBAC Consent  n/a Pap  10/21 NILM    Hgb Electro   n/a    CF      SMA               Previous Version      -Discussed edema in feet - recs for elevating feet and compression socks -GBS/aptima collected  Preterm precautions including but not limited to vaginal bleeding, contractions, leaking of fluid and fetal movement were reviewed in detail with the patient.    Return in about 1 week (around 08/15/2020) for [name].  Orlie Pollen, CNM, MSN [facility], [facility]  08/08/2020, 1:55 PM

## 2020-08-12 LAB — CERVICOVAGINAL ANCILLARY ONLY
Chlamydia: NEGATIVE
Comment: NEGATIVE
Comment: NORMAL
Neisseria Gonorrhea: NEGATIVE

## 2020-08-15 ENCOUNTER — Observation Stay
Admission: EM | Admit: 2020-08-15 | Discharge: 2020-08-15 | Disposition: A | Discharge status: Home / Self Care | Payer: 59 | Attending: Obstetrics and Gynecology | Admitting: Obstetrics and Gynecology

## 2020-08-15 ENCOUNTER — Ambulatory Visit (INDEPENDENT_AMBULATORY_CARE_PROVIDER_SITE_OTHER): Payer: 59 | Admitting: Obstetrics and Gynecology

## 2020-08-15 ENCOUNTER — Other Ambulatory Visit: Payer: Self-pay

## 2020-08-15 ENCOUNTER — Encounter: Payer: Self-pay | Admitting: Obstetrics and Gynecology

## 2020-08-15 VITALS — BP 140/80 | Wt 213.0 lb

## 2020-08-15 DIAGNOSIS — O133 Gestational [pregnancy-induced] hypertension without significant proteinuria, third trimester: Secondary | ICD-10-CM | POA: Diagnosis not present

## 2020-08-15 DIAGNOSIS — O163 Unspecified maternal hypertension, third trimester: Secondary | ICD-10-CM | POA: Diagnosis not present

## 2020-08-15 DIAGNOSIS — Z3A37 37 weeks gestation of pregnancy: Secondary | ICD-10-CM

## 2020-08-15 DIAGNOSIS — Z3493 Encounter for supervision of normal pregnancy, unspecified, third trimester: Secondary | ICD-10-CM

## 2020-08-15 LAB — STREP GP B SUSCEPTIBILITY

## 2020-08-15 LAB — COMPREHENSIVE METABOLIC PANEL
ALT: 9 U/L (ref 0–44)
AST: 19 U/L (ref 15–41)
Albumin: 3 g/dL — ABNORMAL LOW (ref 3.5–5.0)
Alkaline Phosphatase: 88 U/L (ref 38–126)
Anion gap: 7 (ref 5–15)
BUN: 15 mg/dL (ref 6–20)
CO2: 24 mmol/L (ref 22–32)
Calcium: 8.5 mg/dL — ABNORMAL LOW (ref 8.9–10.3)
Chloride: 104 mmol/L (ref 98–111)
Creatinine, Ser: 0.57 mg/dL (ref 0.44–1.00)
GFR, Estimated: >60 mL/min (ref >60)
Glucose, Bld: 94 mg/dL (ref 70–99)
Potassium: 3.7 mmol/L (ref 3.5–5.1)
Sodium: 135 mmol/L (ref 135–145)
Total Bilirubin: 0.8 mg/dL (ref 0.3–1.2)
Total Protein: 6.5 g/dL (ref 6.5–8.1)

## 2020-08-15 LAB — PROTEIN / CREATININE RATIO, URINE
Creatinine, Urine: 190 mg/dL
Protein Creatinine Ratio: 0.31 mg/mg{Cre} — ABNORMAL HIGH (ref 0.00–0.15)
Total Protein, Urine: 59 mg/dL

## 2020-08-15 LAB — POCT URINALYSIS DIPSTICK OB: Glucose, UA: NEGATIVE

## 2020-08-15 LAB — CBC
HCT: 35.5 % — ABNORMAL LOW (ref 36.0–46.0)
Hemoglobin: 12.2 g/dL (ref 12.0–15.0)
MCH: 29.2 pg (ref 26.0–34.0)
MCHC: 34.4 g/dL (ref 30.0–36.0)
MCV: 84.9 fL (ref 80.0–100.0)
Platelets: 278 10*3/uL (ref 150–400)
RBC: 4.18 MIL/uL (ref 3.87–5.11)
RDW: 13.1 % (ref 11.5–15.5)
WBC: 12.7 10*3/uL — ABNORMAL HIGH (ref 4.0–10.5)
nRBC: 0 % (ref 0.0–0.2)

## 2020-08-15 LAB — STREP GP B CULTURE+RFLX: Strep Gp B Culture+Rflx: POSITIVE — AB

## 2020-08-15 NOTE — Discharge Summary (Signed)
Physician Final Progress Note  Patient ID: Rhonda Yates MRN: [id number] DOB/AGE: 1985/07/20 35 y.o.  Admit date: 08/15/2020 Admitting provider: Rod Can, CNM Discharge date: 08/15/2020   Admission Diagnoses: elevated blood pressure in clinic  Discharge Diagnoses:  Active Problems:   Labor and delivery, indication for care   Elevated blood pressure affecting pregnancy in third trimester, antepartum   [redacted] weeks gestation of pregnancy   History of Present Illness: The patient is a 35 y.o. female G1P0 at [redacted]w[redacted]d who presents from the office for [facility] evaluation due to elevated blood pressure. She denies headache, visual changes, epigastric pain. She reports good fetal movement. She denies contractions, leakage of fluid or vaginal bleeding. She was admitted for observation, placed on monitors, labs collected. Monitoring is reassuring. Blood pressure normalized and labs are within normal limits with the exception of slightly elevated P/C ratio. She is discharged to home with instructions and precautions.   Past Medical History:  Diagnosis Date  . Anxiety     Past Surgical History:  Procedure Laterality Date  . TONSILLECTOMY    . TYMPANOSTOMY TUBE PLACEMENT    . WISDOM TOOTH EXTRACTION      No current facility-administered medications on file prior to encounter.   Current Outpatient Medications on File Prior to Encounter  Medication Sig Dispense Refill  . citalopram (CELEXA) 10 MG tablet SMARTSIG:3 Tablet(s) By Mouth Every Other Day PRN    . docusate sodium (COLACE) 100 MG capsule Take 100 mg by mouth 2 (two) times daily.    . ondansetron (ZOFRAN-ODT) 4 MG disintegrating tablet TAKE 1 TABLET BY MOUTH EVERY 6 HOURS AS NEEDED FOR NAUSEA 20 tablet 1  . Prenatal Vit-Fe Fumarate-FA (PRENATAL PO) Take 1 tablet by mouth daily.    . Probiotic Product (PROBIOTIC PO) Take by mouth daily.      Allergies  Allergen Reactions  . Penicillins     Other reaction(s): Other (See Comments),  Unknown Childhood allergy unsure of reaction but can take cephalosporins      Social History   Socioeconomic History  . Marital status: Married    Spouse name: Not on file  . Number of children: Not on file  . Years of education: Not on file  . Highest education level: Not on file  Occupational History  . Not on file  Tobacco Use  . Smoking status: Never Smoker  . Smokeless tobacco: Never Used  Vaping Use  . Vaping Use: Never used  Substance and Sexual Activity  . Alcohol use: Yes    Comment: social  . Drug use: Never  . Sexual activity: Yes  Other Topics Concern  . Not on file  Social History Narrative  . Not on file   Social Determinants of Health   Financial Resource Strain: Not on file  Food Insecurity: Not on file  Transportation Needs: Not on file  Physical Activity: Not on file  Stress: Not on file  Social Connections: Not on file  Intimate Partner Violence: Not on file    Family History  Problem Relation Age of Onset  . Kidney cancer Mother   . High blood pressure Mother   . Cancer Mother        Neuroendocrine cancer  . Healthy Father      Review of Systems  Constitutional: Negative for chills and fever.  HENT: Negative for congestion, ear discharge, ear pain, hearing loss, sinus pain and sore throat.   Eyes: Negative for blurred vision and double vision.  Respiratory: Negative  for cough, shortness of breath and wheezing.   Cardiovascular: Negative for chest pain, palpitations and leg swelling.  Gastrointestinal: Negative for abdominal pain, blood in stool, constipation, diarrhea, heartburn, melena, nausea and vomiting.  Genitourinary: Negative for dysuria, flank pain, frequency, hematuria and urgency.  Musculoskeletal: Negative for back pain, joint pain and myalgias.  Skin: Negative for itching and rash.  Neurological: Negative for dizziness, tingling, tremors, sensory change, speech change, focal weakness, seizures, loss of consciousness, weakness  and headaches.  Endo/Heme/Allergies: Negative for environmental allergies. Does not bruise/bleed easily.  Psychiatric/Behavioral: Negative for depression, hallucinations, memory loss, substance abuse and suicidal ideas. The patient is not nervous/anxious and does not have insomnia.      Physical Exam: BP 121/74   Pulse 67   Temp 98.9 F (37.2 C) (Oral)   Resp 16   Ht 5\' 2"  (1.575 m)   Wt 96.6 kg   LMP 11/13/2019   BMI 38.96 kg/m   Constitutional: Well nourished, well developed female in no acute distress.  HEENT: normal Skin: Warm and dry.  Cardiovascular: Regular rate and rhythm.   Respiratory: Clear to auscultation bilateral. Normal respiratory effort Abdomen: FHT present Back: no CVAT Neuro: DTRs 2+, Cranial nerves grossly intact Psych: Alert and Oriented x3. No memory deficits. Normal mood and affect.   Patient Vitals for the past 24 hrs:  BP Temp Temp src Pulse Resp Height Weight  08/15/20 1714 121/74 -- -- 67 -- -- --  08/15/20 1659 122/75 -- -- 70 -- -- --  08/15/20 1644 128/75 -- -- 66 -- -- --  08/15/20 1630 122/71 -- -- 68 -- -- --  08/15/20 1614 131/75 -- -- 67 -- -- --  08/15/20 1603 124/76 98.9 F (37.2 C) Oral 68 16 5\' 2"  (1.575 m) 96.6 kg    Toco: negative Fetal well being: 130 bpm, moderate variability, +accelerations, -decelerations  Consults: None  Significant Findings/ Diagnostic Studies: labs:  Results for SUI, KASPAREK (MRN [id number]) as of 08/15/2020 19:20  Ref. Range 08/15/2020 16:38 08/15/2020 16:40  COMPREHENSIVE METABOLIC PANEL Unknown Rpt (A)   Sodium Latest Ref Range: 135 - 145 mmol/L 135   Potassium Latest Ref Range: 3.5 - 5.1 mmol/L 3.7   Chloride Latest Ref Range: 98 - 111 mmol/L 104   CO2 Latest Ref Range: 22 - 32 mmol/L 24   Glucose Latest Ref Range: 70 - 99 mg/dL 94   BUN Latest Ref Range: 6 - 20 mg/dL 15   Creatinine Latest Ref Range: 0.44 - 1.00 mg/dL 0.57   Calcium Latest Ref Range: 8.9 - 10.3 mg/dL 8.5 (L)   Anion gap  Latest Ref Range: 5 - 15  7   Alkaline Phosphatase Latest Ref Range: 38 - 126 U/L 88   Albumin Latest Ref Range: 3.5 - 5.0 g/dL 3.0 (L)   AST Latest Ref Range: 15 - 41 U/L 19   ALT Latest Ref Range: 0 - 44 U/L 9   Total Protein Latest Ref Range: 6.5 - 8.1 g/dL 6.5   Total Bilirubin Latest Ref Range: 0.3 - 1.2 mg/dL 0.8   GFR, Estimated Latest Ref Range: >60 mL/min >60   WBC Latest Ref Range: 4.0 - 10.5 K/uL 12.7 (H)   RBC Latest Ref Range: 3.87 - 5.11 MIL/uL 4.18   Hemoglobin Latest Ref Range: 12.0 - 15.0 g/dL 12.2   HCT Latest Ref Range: 36.0 - 46.0 % 35.5 (L)   MCV Latest Ref Range: 80.0 - 100.0 fL 84.9   MCH Latest Ref  Range: 26.0 - 34.0 pg 29.2   MCHC Latest Ref Range: 30.0 - 36.0 g/dL 34.4   RDW Latest Ref Range: 11.5 - 15.5 % 13.1   Platelets Latest Ref Range: 150 - 400 K/uL 278   nRBC Latest Ref Range: 0.0 - 0.2 % 0.0   Total Protein, Urine Latest Units: mg/dL  59  Protein Creatinine Ratio Latest Ref Range: 0.00 - 0.15 mg/mgCre  0.31 (H)  Creatinine, Urine Latest Units: mg/dL  190    Procedures: NST  Hospital Course: The patient was admitted to Labor and Delivery Triage for observation.   Discharge Condition: good  Disposition: Discharge disposition: 01-Home or Self Care  Diet: Regular diet  Discharge Activity: Activity as tolerated  Discharge Instructions    Discharge activity:  No Restrictions   Complete by: As directed    Discharge diet:  No restrictions   Complete by: As directed    LABOR:  When conractions begin, you should start to time them from the beginning of one contraction to the beginning  of the next.  When contractions are 5 - 10 minutes apart or less and have been regular for at least an hour, you should call your health care provider.   Complete by: As directed    No sexual activity restrictions   Complete by: As directed    Notify physician for bleeding from the vagina   Complete by: As directed    Notify physician for blurring of vision or spots  before the eyes   Complete by: As directed    Notify physician for chills or fever   Complete by: As directed    Notify physician for fainting spells, "black outs" or loss of consciousness   Complete by: As directed    Notify physician for increase in vaginal discharge   Complete by: As directed    Notify physician for leaking of fluid   Complete by: As directed    Notify physician for pain or burning when urinating   Complete by: As directed    Notify physician for pelvic pressure (sudden increase)   Complete by: As directed    Notify physician for severe or continued nausea or vomiting   Complete by: As directed    Notify physician for sudden gushing of fluid from the vagina (with or without continued leaking)   Complete by: As directed    Notify physician for sudden, constant, or occasional abdominal pain   Complete by: As directed    Notify physician if baby moving less than usual   Complete by: As directed      Allergies as of 08/15/2020      Reactions   Penicillins    Other reaction(s): Other (See Comments), Unknown Childhood allergy unsure of reaction but can take cephalosporins       Medication List    TAKE these medications   citalopram 10 MG tablet Commonly known as: CELEXA SMARTSIG:3 Tablet(s) By Mouth Every Other Day PRN   docusate sodium 100 MG capsule Commonly known as: COLACE Take 100 mg by mouth 2 (two) times daily.   ondansetron 4 MG disintegrating tablet Commonly known as: ZOFRAN-ODT TAKE 1 TABLET BY MOUTH EVERY 6 HOURS AS NEEDED FOR NAUSEA   PRENATAL PO Take 1 tablet by mouth daily.   PROBIOTIC PO Take by mouth daily.       Helper. Go to.   Specialty: Obstetrics and Gynecology Why: scheduled prenatal appointment Contact information: [address]  [place] [id number] [phone]              Total time spent taking care of this patient: less than 30  minutes  Signed: Rod Can, CNM  08/15/2020, 7:08 PM

## 2020-08-15 NOTE — OB Triage Note (Signed)
Patient G1P0 [redacted]w[redacted]d sent over from office for [facility] workup. Patient denies headache, blurry vision, seeing spots, RUQ pain. Patient has some swelling in bilateral lower extremities (nonpitting), but reports this has been normal in her pregnancy. Patient has +2 reflexes and absent clonus. BPs cycling and WNL. CNM aware of patient arrival and labs being drawn. Monitors applied and assessing.

## 2020-08-15 NOTE — Progress Notes (Signed)
Routine Prenatal Care Visit  Subjective  Rhonda Yates is a 35 y.o. G1P0 at [redacted]w[redacted]d being seen today for ongoing prenatal care.  She is currently monitored for the following issues for this low-risk pregnancy and has Supervision of low-risk pregnancy, third trimester; Abnormal glucose affecting pregnancy; Obesity affecting pregnancy in first trimester; and Labor and delivery, indication for care on their problem list.  ----------------------------------------------------------------------------------- Patient reports no complaints.   Contractions: Not present. Vag. Bleeding: None.  Movement: Present. Denies leaking of fluid.  ----------------------------------------------------------------------------------- The following portions of the patient's history were reviewed and updated as appropriate: allergies, current medications, past family history, past medical history, past social history, past surgical history and problem list. Problem list updated.  Objective  Blood pressure 140/80, 140/80 weight 213 lb (96.6 kg), last menstrual period 11/13/2019. Pregravid weight 197 lb (89.4 kg) Total Weight Gain 16 lb (7.258 kg) Urinalysis:      Fetal Status: Fetal Heart Rate (bpm): 130 Fundal Height: 37 cm Movement: Present  Presentation: Vertex  General:  Alert, oriented and cooperative. Patient is in no acute distress.  Skin: Skin is warm and dry. No rash noted.   Cardiovascular: Normal heart rate noted  Respiratory: Normal respiratory effort, no problems with respiration noted  Abdomen: Soft, gravid, appropriate for gestational age. Pain/Pressure: Absent     Pelvic:  Cervical exam deferred        Extremities: Normal range of motion.  Edema: Mild pitting, slight indentation  ental Status: Normal mood and affect. Normal behavior. Normal judgment and thought content.   Assessment   35 y.o. G1P0 at [redacted]w[redacted]d by  09/01/2020, by Ultrasound presenting for routine prenatal visit  Plan   pregnancy  1 Problems (from 11/13/19 to present)    Problem Noted Resolved   Supervision of low-risk pregnancy, third trimester 01/24/2020 by Gae Dry, MD No   Overview Addendum 08/15/2020  4:47 PM by Orlie Pollen, CNM     Nursing Staff Provider  Office Location  [facility] Dating   8wk [place]  Language  English Anatomy US   complete  Flu Vaccine    Genetic Screen  NIPS: declined   TDaP vaccine   07/24/20 Hgb A1C or  GTT Early: 141 Early 3h: 74, 132, 109, 87 Third trimester: passed 3 h  Rhogam   n/a   LAB RESULTS   Feeding Plan  breast Blood Type B/Positive/-- (10/12 1141)   Contraception  POP Antibody Negative (10/12 1141)  Circumcision  Rubella 3.98 (10/12 1141)  Pediatrician   RPR Non Reactive (10/12 1141)   Support Person  husband "[name]" HBsAg Negative (10/12 1141)   Prenatal Classes  information provided HIV Non Reactive (10/12 1141)    Varicella  immune  BTL Consent  n/a GBS   Positive       VBAC Consent  n/a Pap  10/21 NILM    Hgb Electro   n/a    CF      SMA               Previous Version      -Reviewed GBS result from previously visit -Discussed options for eIOL vs spontaneous onset of labor - patient currently desires spontaneous onset of labor -Confirmed vtx presentation with [place] -Patient with elevated BP x2 at today's visit - sent to [facility] triage for [facility] evaluation, on-call provider notified  Term labor precautions including but not limited to vaginal bleeding, contractions, leaking of fluid and fetal movement were reviewed in detail with  the patient.    Return for [name] in 1 week  [place], MSN [facility], [place] 08/15/2020, 4:48 PM

## 2020-08-15 NOTE — Progress Notes (Signed)
RN and CNM reviewed discharge instructions ([place] instructions, when to follow up with provider) with patient and support person. Patient discharged home in stable condition.

## 2020-08-19 ENCOUNTER — Ambulatory Visit
Admission: RE | Admit: 2020-08-19 | Discharge: 2020-08-19 | Disposition: A | Discharge status: Home / Self Care | Payer: 59 | Source: Ambulatory Visit | Attending: Family Medicine | Admitting: Family Medicine

## 2020-08-19 ENCOUNTER — Other Ambulatory Visit: Payer: Self-pay

## 2020-08-19 VITALS — BP 114/71 | HR 68 | Temp 98.4°F | Resp 18 | Wt 213.0 lb

## 2020-08-19 DIAGNOSIS — J069 Acute upper respiratory infection, unspecified: Secondary | ICD-10-CM | POA: Diagnosis not present

## 2020-08-19 LAB — GROUP A STREP BY PCR: Group A Strep by PCR: NOT DETECTED

## 2020-08-19 NOTE — ED Triage Notes (Signed)
Pt c/o sore throat and right ear pain.

## 2020-08-19 NOTE — Discharge Instructions (Signed)
Tylenol as needed.  Strep negative.  Take care  Dr. Lacinda Axon

## 2020-08-19 NOTE — ED Provider Notes (Signed)
[facility]    CSN: [id number] Arrival date & time: 08/19/20  1138  History   Chief Complaint Chief Complaint  Patient presents with  . Sore Throat   HPI  35 year old female presents with respiratory symptoms.  Patient reports that she has had right ear pain for the past 2 weeks.  Over the past 2 days has developed sore throat, postnasal drip.  No fever.  She is currently pregnant.  No fever.  No relieving factors.  No other reported symptoms.  No other complaints.  Past Medical History:  Diagnosis Date  . Anxiety     Patient Active Problem List   Diagnosis Date Noted  . Labor and delivery, indication for care 08/15/2020  . Elevated blood pressure affecting pregnancy in third trimester, antepartum   . [redacted] weeks gestation of pregnancy   . Abnormal glucose affecting pregnancy 03/06/2020  . Obesity affecting pregnancy in first trimester 03/06/2020  . Supervision of low-risk pregnancy, third trimester 01/24/2020    Past Surgical History:  Procedure Laterality Date  . TONSILLECTOMY    . TYMPANOSTOMY TUBE PLACEMENT    . WISDOM TOOTH EXTRACTION      OB History    Gravida  1   Para      Term      Preterm      AB      Living        SAB      IAB      Ectopic      Multiple      Live Births               Home Medications    Prior to Admission medications   Medication Sig Start Date End Date Taking? Authorizing Provider  citalopram (CELEXA) 10 MG tablet SMARTSIG:3 Tablet(s) By Mouth Every Other Day PRN 04/23/19  Yes [provider]  docusate sodium (COLACE) 100 MG capsule Take 100 mg by mouth 2 (two) times daily.   Yes [provider]  ondansetron (ZOFRAN-ODT) 4 MG disintegrating tablet TAKE 1 TABLET BY MOUTH EVERY 6 HOURS AS NEEDED FOR NAUSEA 04/18/20  Yes Gae Dry, MD  Prenatal Vit-Fe Fumarate-FA (PRENATAL PO) Take 1 tablet by mouth daily.   Yes [provider]  Probiotic Product (PROBIOTIC PO) Take by  mouth daily.   Yes [provider]    Family History Family History  Problem Relation Age of Onset  . Kidney cancer Mother   . High blood pressure Mother   . Cancer Mother        Neuroendocrine cancer  . Healthy Father     Social History Social History   Tobacco Use  . Smoking status: Never Smoker  . Smokeless tobacco: Never Used  Vaping Use  . Vaping Use: Never used  Substance Use Topics  . Alcohol use: Yes    Comment: social  . Drug use: Never     Allergies   Penicillins   Review of Systems Review of Systems Per HPI  Physical Exam Triage Vital Signs ED Triage Vitals  Enc Vitals Group     BP 08/19/20 1157 114/71     Pulse Rate 08/19/20 1157 68     Resp 08/19/20 1157 18     Temp 08/19/20 1157 98.4 F (36.9 C)     Temp Source 08/19/20 1157 Oral     SpO2 08/19/20 1157 99 %     Weight 08/19/20 1156 213 lb (96.6 kg)  Height --      Head Circumference --      Peak Flow --      Pain Score 08/19/20 1156 0     Pain Loc --      Pain Edu? --      Excl. in [place]? --    Updated Vital Signs BP 114/71 (BP Location: Left Arm)   Pulse 68   Temp 98.4 F (36.9 C) (Oral)   Resp 18   Wt 96.6 kg   LMP 11/13/2019   SpO2 99%   BMI 38.96 kg/m   Visual Acuity Right Eye Distance:   Left Eye Distance:   Bilateral Distance:    Right Eye Near:   Left Eye Near:    Bilateral Near:     Physical Exam Vitals reviewed.  Constitutional:      General: She is not in acute distress.    Appearance: Normal appearance.  HENT:     Head: Normocephalic and atraumatic.     Right Ear: Tympanic membrane normal.     Left Ear: Tympanic membrane normal.     Mouth/Throat:     Pharynx: Oropharynx is clear. No oropharyngeal exudate or posterior oropharyngeal erythema.  Cardiovascular:     Rate and Rhythm: Normal rate and regular rhythm.     Heart sounds: No murmur heard.   Pulmonary:     Effort: Pulmonary effort is normal.     Breath sounds: Normal breath sounds. No  wheezing, rhonchi or rales.  Neurological:     Mental Status: She is alert.  Psychiatric:        Mood and Affect: Mood normal.        Behavior: Behavior normal.    UC Treatments / Results  Labs (all labs ordered are listed, but only abnormal results are displayed) Labs Reviewed  GROUP A STREP BY PCR    EKG   Radiology No results found.  Procedures Procedures (including critical care time)  Medications Ordered in UC Medications - No data to display  Initial Impression / Assessment and Plan / UC Course  I have reviewed the triage vital signs and the nursing notes.  Pertinent labs & imaging results that were available during my care of the patient were reviewed by me and considered in my medical decision making (see chart for details).    35 year old female presents with URI.  Tylenol as needed.  Supportive care.  Strep testing negative today.  Final Clinical Impressions(s) / UC Diagnoses   Final diagnoses:  Upper respiratory tract infection, unspecified type     Discharge Instructions     Tylenol as needed.  Strep negative.  Take care  Dr. Lacinda Axon    ED Prescriptions    None     PDMP not reviewed this encounter.   Coral Spikes, [place] 08/19/20 1337

## 2020-08-22 ENCOUNTER — Ambulatory Visit (INDEPENDENT_AMBULATORY_CARE_PROVIDER_SITE_OTHER): Payer: 59 | Admitting: Obstetrics

## 2020-08-22 ENCOUNTER — Other Ambulatory Visit: Payer: Self-pay

## 2020-08-22 VITALS — BP 118/70 | Ht 62.0 in | Wt 216.2 lb

## 2020-08-22 DIAGNOSIS — Z3A38 38 weeks gestation of pregnancy: Secondary | ICD-10-CM

## 2020-08-22 DIAGNOSIS — Z3493 Encounter for supervision of normal pregnancy, unspecified, third trimester: Secondary | ICD-10-CM

## 2020-08-22 LAB — POCT URINALYSIS DIPSTICK OB
Glucose, UA: NEGATIVE
POC,PROTEIN,UA: NEGATIVE

## 2020-08-22 NOTE — Addendum Note (Signed)
Addended by: Vernia Buff A on: 08/22/2020 04:29 PM   Modules accepted: Orders

## 2020-08-22 NOTE — Progress Notes (Signed)
Routine Prenatal Care Visit  Subjective  Rhonda Yates is a 35 y.o. G1P0 at [redacted]w[redacted]d being seen today for ongoing prenatal care.  She is currently monitored for the following issues for this low-risk pregnancy and has Supervision of low-risk pregnancy, third trimester; Abnormal glucose affecting pregnancy; Obesity affecting pregnancy in first trimester; Labor and delivery, indication for care; Elevated blood pressure affecting pregnancy in third trimester, antepartum; and [redacted] weeks gestation of pregnancy on their problem list.  ----------------------------------------------------------------------------------- Patient reports no complaints.  She is eager for labor. Her baby is moving well, and she denies any regular contractions. Contractions: Not present. Vag. Bleeding: None.  Movement: Present. Leaking Fluid denies.  ----------------------------------------------------------------------------------- The following portions of the patient's history were reviewed and updated as appropriate: allergies, current medications, past family history, past medical history, past social history, past surgical history and problem list. Problem list updated.  Objective  Blood pressure 118/70, height 5\' 2"  (1.575 m), weight 216 lb 3.2 oz (98.1 kg), last menstrual period 11/13/2019. Pregravid weight 197 lb (89.4 kg) Total Weight Gain 19 lb 3.2 oz (8.709 kg) Urinalysis: Urine Protein    Urine Glucose    Fetal Status:     Movement: Present     General:  Alert, oriented and cooperative. Patient is in no acute distress.  Skin: Skin is warm and dry. No rash noted.   Cardiovascular: Normal heart rate noted  Respiratory: Normal respiratory effort, no problems with respiration noted  Abdomen: Soft, gravid, appropriate for gestational age. Pain/Pressure: Absent     Pelvic:  Cervical exam performed      posterior cervix- 1.5 cms/70%/-3. Soft and stretchy.  Extremities: Normal range of motion.     Mental Status:  Normal mood and affect. Normal behavior. Normal judgment and thought content.   Assessment   35 y.o. G1P0 at [redacted]w[redacted]d by  09/01/2020, by Ultrasound presenting for routine prenatal visit  Plan   pregnancy 1 Problems (from 11/13/19 to present)    Problem Noted Resolved   Supervision of low-risk pregnancy, third trimester 01/24/2020 by Gae Dry, MD No   Overview Addendum 08/15/2020  4:47 PM by Orlie Pollen, CNM     Nursing Staff Provider  Office Location  [facility] Dating   8wk [place]  Language  English Anatomy US   complete  Flu Vaccine    Genetic Screen  NIPS: declined   TDaP vaccine   07/24/20 Hgb A1C or  GTT Early: 141 Early 3h: 74, 132, 109, 87 Third trimester: passed 3 h  Rhogam   n/a   LAB RESULTS   Feeding Plan  breast Blood Type B/Positive/-- (10/12 1141)   Contraception  POP Antibody Negative (10/12 1141)  Circumcision  Rubella 3.98 (10/12 1141)  Pediatrician   RPR Non Reactive (10/12 1141)   Support Person  husband "[name]" HBsAg Negative (10/12 1141)   Prenatal Classes  information provided HIV Non Reactive (10/12 1141)    Varicella  immune  BTL Consent  n/a GBS   Positive       VBAC Consent  n/a Pap  10/21 NILM    Hgb Electro   n/a    CF      SMA               Previous Version       Term labor symptoms and general obstetric precautions including but not limited to vaginal bleeding, contractions, leaking of fluid and fetal movement were reviewed in detail with the patient. Please refer to After  Visit Summary for other counseling recommendations.  Natural cervical ripeners described.  Return in about 1 week (around 08/29/2020) for return OB.  Imagene Riches, CNM  08/22/2020 2:10 PM

## 2020-08-26 ENCOUNTER — Other Ambulatory Visit: Payer: Self-pay

## 2020-08-26 ENCOUNTER — Ambulatory Visit (INDEPENDENT_AMBULATORY_CARE_PROVIDER_SITE_OTHER): Payer: 59 | Admitting: Obstetrics

## 2020-08-26 VITALS — BP 128/74 | Ht 62.0 in | Wt 219.0 lb

## 2020-08-26 DIAGNOSIS — Z3A39 39 weeks gestation of pregnancy: Secondary | ICD-10-CM

## 2020-08-26 DIAGNOSIS — Z3493 Encounter for supervision of normal pregnancy, unspecified, third trimester: Secondary | ICD-10-CM

## 2020-08-26 LAB — POCT URINALYSIS DIPSTICK OB
Glucose, UA: NEGATIVE
POC,PROTEIN,UA: NEGATIVE

## 2020-08-26 NOTE — Progress Notes (Signed)
Routine Prenatal Care Visit  Subjective  Rhonda Yates is a 35 y.o. G1P0 at [redacted]w[redacted]d being seen today for ongoing prenatal care.  She is currently monitored for the following issues for this low-risk pregnancy and has Supervision of low-risk pregnancy, third trimester; Abnormal glucose affecting pregnancy; Obesity affecting pregnancy in first trimester; Labor and delivery, indication for care; Elevated blood pressure affecting pregnancy in third trimester, antepartum; and [redacted] weeks gestation of pregnancy on their problem list.  ----------------------------------------------------------------------------------- Patient reports no complaints.  She would like a membrane sweep. Has been using "spinning Babies positions to facilitate optimal fetla positioning. Contractions: Irritability. Vag. Bleeding: None.  Movement: Present. Leaking Fluid denies.  ----------------------------------------------------------------------------------- The following portions of the patient's history were reviewed and updated as appropriate: allergies, current medications, past family history, past medical history, past social history, past surgical history and problem list. Problem list updated.  Objective  Blood pressure 128/74, height 5\' 2"  (1.575 m), weight 219 lb (99.3 kg), last menstrual period 11/13/2019. Pregravid weight 197 lb (89.4 kg) Total Weight Gain 22 lb (9.979 kg) Urinalysis: Urine Protein Negative  Urine Glucose Negative  Fetal Status:     Movement: Present     General:  Alert, oriented and cooperative. Patient is in no acute distress.  Skin: Skin is warm and dry. No rash noted.   Cardiovascular: Normal heart rate noted  Respiratory: Normal respiratory effort, no problems with respiration noted  Abdomen: Soft, gravid, appropriate for gestational age. Pain/Pressure: Present     Pelvic:  Cervical exam performed      2.5 cms/70%/-3 soft and stretchy- membrane sweep  Extremities: Normal range of  motion.     Mental Status: Normal mood and affect. Normal behavior. Normal judgment and thought content.   Assessment   35 y.o. G1P0 at [redacted]w[redacted]d by  09/01/2020, by Ultrasound presenting for routine prenatal visit  Plan   pregnancy 1 Problems (from 11/13/19 to present)    Problem Noted Resolved   Supervision of low-risk pregnancy, third trimester 01/24/2020 by Gae Dry, MD No   Overview Addendum 08/15/2020  4:47 PM by Orlie Pollen, CNM     Nursing Staff Provider  Office Location  [facility] Dating   8wk [place]  Language  English Anatomy US   complete  Flu Vaccine    Genetic Screen  NIPS: declined   TDaP vaccine   07/24/20 Hgb A1C or  GTT Early: 141 Early 3h: 74, 132, 109, 87 Third trimester: passed 3 h  Rhogam   n/a   LAB RESULTS   Feeding Plan  breast Blood Type B/Positive/-- (10/12 1141)   Contraception  POP Antibody Negative (10/12 1141)  Circumcision  Rubella 3.98 (10/12 1141)  Pediatrician   RPR Non Reactive (10/12 1141)   Support Person  husband "[name]" HBsAg Negative (10/12 1141)   Prenatal Classes  information provided HIV Non Reactive (10/12 1141)    Varicella  immune  BTL Consent  n/a GBS   Positive       VBAC Consent  n/a Pap  10/21 NILM    Hgb Electro   n/a    CF      SMA               Previous Version       Term labor symptoms and general obstetric precautions including but not limited to vaginal bleeding, contractions, leaking of fluid and fetal movement were reviewed in detail with the patient. Please refer to After Visit Summary for other counseling recommendations.  jShe will RTC fro another sweep on Friday this week if she does not labor.  Return in about 1 week (around 09/02/2020) for return OB, set up IOL?Imagene Riches, CNM  08/26/2020 4:38 PM

## 2020-08-29 ENCOUNTER — Ambulatory Visit (INDEPENDENT_AMBULATORY_CARE_PROVIDER_SITE_OTHER): Payer: 59 | Admitting: Advanced Practice Midwife

## 2020-08-29 ENCOUNTER — Encounter: Payer: Self-pay | Admitting: Advanced Practice Midwife

## 2020-08-29 ENCOUNTER — Other Ambulatory Visit: Payer: Self-pay

## 2020-08-29 VITALS — BP 130/80 | Wt 219.0 lb

## 2020-08-29 DIAGNOSIS — Z3493 Encounter for supervision of normal pregnancy, unspecified, third trimester: Secondary | ICD-10-CM

## 2020-08-29 DIAGNOSIS — Z3A39 39 weeks gestation of pregnancy: Secondary | ICD-10-CM

## 2020-08-29 NOTE — Progress Notes (Signed)
Routine Prenatal Care Visit  Subjective  Rhonda Yates is a 35 y.o. G1P0 at [redacted]w[redacted]d being seen today for ongoing prenatal care.  She is currently monitored for the following issues for this low-risk pregnancy and has Supervision of low-risk pregnancy, third trimester; Abnormal glucose affecting pregnancy; Obesity affecting pregnancy in first trimester; Labor and delivery, indication for care; Elevated blood pressure affecting pregnancy in third trimester, antepartum; and [redacted] weeks gestation of pregnancy on their problem list.  ----------------------------------------------------------------------------------- Patient reports no complaints.  She requests IOL. Contractions: Not present. Vag. Bleeding: None.  Movement: Present. Leaking Fluid denies.  ----------------------------------------------------------------------------------- The following portions of the patient's history were reviewed and updated as appropriate: allergies, current medications, past family history, past medical history, past social history, past surgical history and problem list. Problem list updated.  Objective  Blood pressure 130/80, weight 219 lb (99.3 kg), last menstrual period 11/13/2019. Pregravid weight 197 lb (89.4 kg) Total Weight Gain 22 lb (9.979 kg) Urinalysis: Urine Protein    Urine Glucose    Fetal Status: Fetal Heart Rate (bpm): 147   Movement: Present  Presentation: Vertex  General:  Alert, oriented and cooperative. Patient is in no acute distress.  Skin: Skin is warm and dry. No rash noted.   Cardiovascular: Normal heart rate noted  Respiratory: Normal respiratory effort, no problems with respiration noted  Abdomen: Soft, gravid, appropriate for gestational age. Pain/Pressure: Present     Pelvic:  cervical sweep Dilation: 3 Effacement (%): 80 Station: -2  Extremities: Normal range of motion.  Edema: Trace  Mental Status: Normal mood and affect. Normal behavior. Normal judgment and thought content.    Assessment   35 y.o. G1P0 at [redacted]w[redacted]d by  09/01/2020, by Ultrasound presenting for routine prenatal visit  Plan   pregnancy 1 Problems (from 11/13/19 to present)    Problem Noted Resolved   Supervision of low-risk pregnancy, third trimester 01/24/2020 by Gae Dry, MD No   Overview Addendum 08/15/2020  4:47 PM by Orlie Pollen, CNM     Nursing Staff Provider  Office Location  [facility] Dating   8wk [place]  Language  English Anatomy US   complete  Flu Vaccine    Genetic Screen  NIPS: declined   TDaP vaccine   07/24/20 Hgb A1C or  GTT Early: 141 Early 3h: 74, 132, 109, 87 Third trimester: passed 3 h  Rhogam   n/a   LAB RESULTS   Feeding Plan  breast Blood Type B/Positive/-- (10/12 1141)   Contraception  POP Antibody Negative (10/12 1141)  Circumcision  Rubella 3.98 (10/12 1141)  Pediatrician   RPR Non Reactive (10/12 1141)   Support Person  husband "[name]" HBsAg Negative (10/12 1141)   Prenatal Classes  information provided HIV Non Reactive (10/12 1141)    Varicella  immune  BTL Consent  n/a GBS   Positive       VBAC Consent  n/a Pap  10/21 NILM    Hgb Electro   n/a    CF      SMA               Previous Version       Term labor symptoms and general obstetric precautions including but not limited to vaginal bleeding, contractions, leaking of fluid and fetal movement were reviewed in detail with the patient. Please refer to After Visit Summary for other counseling recommendations.   Return in about 1 week (around 09/05/2020) for nst/rob.  Rod Can, CNM 08/29/2020 2:55 PM

## 2020-08-29 NOTE — H&P (Signed)
OB History & Physical   History of Present Illness:  Chief Complaint: elective IOL  HPI:  Rhonda Yates is a 35 y.o. G1P0 female at [redacted]w[redacted]d dated by 8 week ultrasound.  Her pregnancy has been complicated by obesity, abnormal glucose without gestational diabetes, isolated elevated blood pressure.    She denies contractions.   She denies leakage of fluid.   She denies vaginal bleeding.   She reports fetal movement.    Total weight gain for pregnancy: 22 lb (9.979 kg)   Obstetrical Problem List: pregnancy 1 Problems (from 11/13/19 to present)    Problem Noted Resolved   Supervision of low-risk pregnancy, third trimester 01/24/2020 by Gae Dry, MD No   Overview Addendum 08/15/2020  4:47 PM by Orlie Pollen, CNM     Nursing Staff Provider  Office Location  [facility] Dating   8wk [place]  Language  English Anatomy US   complete  Flu Vaccine    Genetic Screen  NIPS: declined   TDaP vaccine   07/24/20 Hgb A1C or  GTT Early: 141 Early 3h: 74, 132, 109, 87 Third trimester: passed 3 h  Rhogam   n/a   LAB RESULTS   Feeding Plan  breast Blood Type B/Positive/-- (10/12 1141)   Contraception  POP Antibody Negative (10/12 1141)  Circumcision  Rubella 3.98 (10/12 1141)  Pediatrician   RPR Non Reactive (10/12 1141)   Support Person  husband "[name]" HBsAg Negative (10/12 1141)   Prenatal Classes  information provided HIV Non Reactive (10/12 1141)    Varicella  immune  BTL Consent  n/a GBS   Positive       VBAC Consent  n/a Pap  10/21 NILM    Hgb Electro   n/a    CF      SMA               Previous Version       Maternal Medical History:   Past Medical History:  Diagnosis Date  . Anxiety     Past Surgical History:  Procedure Laterality Date  . TONSILLECTOMY    . TYMPANOSTOMY TUBE PLACEMENT    . WISDOM TOOTH EXTRACTION      Allergies  Allergen Reactions  . Penicillins     Other reaction(s): Other (See Comments), Unknown Childhood allergy unsure of reaction but can take  cephalosporins      Prior to Admission medications   Medication Sig Start Date End Date Taking? Authorizing Provider  citalopram (CELEXA) 10 MG tablet SMARTSIG:3 Tablet(s) By Mouth Every Other Day PRN 04/23/19   [provider]  docusate sodium (COLACE) 100 MG capsule Take 100 mg by mouth 2 (two) times daily.    [provider]  ondansetron (ZOFRAN-ODT) 4 MG disintegrating tablet TAKE 1 TABLET BY MOUTH EVERY 6 HOURS AS NEEDED FOR NAUSEA 04/18/20   Gae Dry, MD  Prenatal Vit-Fe Fumarate-FA (PRENATAL PO) Take 1 tablet by mouth daily.    [provider]  Probiotic Product (PROBIOTIC PO) Take by mouth daily.    [provider]    OB History  Gravida Para Term Preterm AB Living  1            SAB IAB Ectopic Multiple Live Births               # Outcome Date GA Lbr Len/2nd Weight Sex Delivery Anes PTL Lv  1 Current            Prenatal care  site: [facility]  Social History: She  reports that she has never smoked. She has never used smokeless tobacco. She reports current alcohol use. She reports that she does not use drugs.  Family History: family history includes Cancer in her mother; Healthy in her father; High blood pressure in her mother; Kidney cancer in her mother.    Review of Systems:  Review of Systems  Constitutional: Negative for chills and fever.  HENT: Negative for congestion, ear discharge, ear pain, hearing loss, sinus pain and sore throat.   Eyes: Negative for blurred vision and double vision.  Respiratory: Negative for cough, shortness of breath and wheezing.   Cardiovascular: Negative for chest pain, palpitations and leg swelling.  Gastrointestinal: Negative for abdominal pain, blood in stool, constipation, diarrhea, heartburn, melena, nausea and vomiting.  Genitourinary: Negative for dysuria, flank pain, frequency, hematuria and urgency.  Musculoskeletal: Negative for back pain, joint pain and myalgias.  Skin: Negative  for itching and rash.  Neurological: Negative for dizziness, tingling, tremors, sensory change, speech change, focal weakness, seizures, loss of consciousness, weakness and headaches.  Endo/Heme/Allergies: Negative for environmental allergies. Does not bruise/bleed easily.  Psychiatric/Behavioral: Negative for depression, hallucinations, memory loss, substance abuse and suicidal ideas. The patient is not nervous/anxious and does not have insomnia.      Physical Exam:  BP 130/80   Wt 219 lb (99.3 kg)   LMP 11/13/2019   BMI 40.06 kg/m   Constitutional: Well nourished, well developed female in no acute distress.  HEENT: normal Skin: Warm and dry.  Cardiovascular: Regular rate and rhythm.   Extremity: trace edema  Respiratory: Clear to auscultation bilateral. Normal respiratory effort Abdomen: FHT present Back: no CVAT Neuro: DTRs 2+, Cranial nerves grossly intact Psych: Alert and Oriented x3. No memory deficits. Normal mood and affect.  MS: normal gait, normal bilateral lower extremity ROM/strength/stability.  Pelvic exam:  is not limited by body habitus EGBUS: within normal limits Vagina: within normal limits and with normal mucosa  Cervix: 3/80/-2  Covid test on admission  No results found for: SARSCOV2NAA]  Assessment:  Rhonda Yates is a 35 y.o. G1P0 female at [redacted]w[redacted]d with elective induction.   Plan:  1. Admit to Labor & Delivery  2. CBC, T&S, Clrs, IVF 3. GBS positive: penicillin allergic/clindamycin susceptible: 900 mg q 8 hrs  4. Fetal well-being: reassuring 5. Begin induction based on admission exam   Rod Can, CNM 08/29/2020 4:17 PM

## 2020-08-29 NOTE — Patient Instructions (Signed)
Pain Relief During Labor and Delivery Many things can cause pain during labor and delivery, including:  Pressure due to the baby moving through the pelvis.  Stretching of tissues due to the baby moving through the birth canal.  Muscle tension due to anxiety or nervousness.  The uterus tightening (contracting)and relaxing to help move the baby. How do I get pain relief during labor and delivery? Discuss your pain relief options with your health care provider during your prenatal visits. Explore the options offered by your hospital or birth center. There are many ways to deal with the pain of labor and delivery. You can try relaxation techniques or doing relaxing activities, taking a warm shower or bath (hydrotherapy), or other methods. There are also many medicines available to help control pain. Relaxation techniques and activities Practice relaxation techniques or do relaxing activities, such as:  Focused breathing.  Meditation.  Visualization.  Aroma therapy.  Listening to your favorite music.  Hypnosis. Hydrotherapy Take a warm shower or bath. This may:  Provide comfort and relaxation.  Lessen your feeling of pain.  Reduce the amount of pain medicine needed.  Shorten the length of labor. Other methods Try doing other things, such as:  Getting a massage or having counterpressure on your back.  Applying warm packs or ice packs.  Changing positions often, moving around, or using a birthing ball. Medicines You may be given:  Pain medicine through an IV or an injection into a muscle.  Pain medicine inserted into your spinal column.  Injections of sterile water just under the skin on your lower back.  Nitrous oxide inhalation therapy, also called laughing gas.   What kinds of medicine are available for pain relief? There are two kinds of medicines that can be used to relieve pain during labor and delivery:  Analgesics. These medicines decrease pain without  causing you to lose feeling or the ability to move your muscles.  Anesthetics. These medicines block feeling in the body and can decrease your ability to move freely. Both kinds of medicine can cause minor side effects, such as nausea, trouble concentrating, and sleepiness. They can also affect the baby's heart rate before birth and his or her breathing after birth. For this reason, health care providers are careful about when and how much medicine is given. Which medicines are used to provide pain relief? Common medicines The most common medicines used to help manage pain during labor and delivery include:  Opioids. Opioids are medicines that decrease how much pain you feel (perception of pain). These medicines can be given through an IV or may be used with anesthetics to block pain.  Epidural analgesia. ? Epidural analgesia is given through a very thin tube that is inserted into the lower back. Medicine is delivered continuously to the area near your spinal column nerves (epidural space). After having this treatment, you may be able to move your legs, but you will not be able to walk. Depending on the amount and type of medicine given, you may lose all feeling in the lower half of your body, or you may have some sensation, including the urge to push. This treatment can be used to give pain relief for a vaginal birth. ? Sometimes, a numbing medicine is injected into the spinal fluid when an epidural catheter is placed. This provides for immediate relief but only lasts for 1-2 hours. Once it wears off, the epidural will provide pain relief. This is called a combined spinal-epidural (CSE) block.  Intrathecal analgesia (  spinal analgesia). Intrathecal analgesia is similar to epidural analgesia, but the medicine is injected into the spinal fluid instead of the epidural space. It is usually only given once. It starts to relieve pain quickly, but the pain relief lasts only 1-2 hours.  Pudendal block. This  block is done by injecting numbing medicine through the wall of the vagina and into a nerve in the pelvis. Other medicines Other medicines used to help manage pain during labor and delivery include:  Local anesthetics. These are used to numb a small area of the body. They may be used along with another kind of medicine or used to numb the nerves of the vagina, cervix, and perineum during the second stage of labor.  Spinal block (spinal anesthesia). Spinal anesthesia is similar to spinal analgesia, but the medicine that is used contains longer-acting numbing medicines and pain medicines. This type of anesthesia can be used for a cesarean delivery and allows you to stay awake for the birth of your baby.  General anesthetics cause you to lose consciousness so you do not feel pain. They are usually only used for an emergency cesarean delivery. These medicines are given through an IV or a mask or both. These medicines are used as part of a procedure or for an emergency delivery. Summary  Women have many options to help them manage the pain associated with labor and delivery.  You can try doing relaxing activities, taking a warm shower or bath, or other methods.  There are also many medicines available to help control pain during labor and delivery.  Talk with your health care provider about what options are available to you. This information is not intended to replace advice given to you by your health care provider. Make sure you discuss any questions you have with your health care provider. Document Revised: 01/31/2019 Document Reviewed: 01/31/2019 Elsevier Patient Education  [place]. Vaginal Delivery  Vaginal delivery means that you give birth by pushing your baby out of your birth canal (vagina). A team of health care providers will help you before, during, and after vaginal delivery. Birth experiences are unique for every woman and every pregnancy, and birth experiences vary  depending on where you choose to give birth. What happens when I arrive at the birth center or hospital? Once you are in labor and have been admitted into the hospital or birth center, your health care provider may:  Review your pregnancy history and any concerns that you have.  Insert an IV into one of your veins. This may be used to give you fluids and medicines.  Check your blood pressure, pulse, temperature, and heart rate (vital signs).  Check whether your bag of water (amniotic sac) has broken (ruptured).  Talk with you about your birth plan and discuss pain control options. Monitoring Your health care provider may monitor your contractions (uterine monitoring) and your baby's heart rate (fetal monitoring). You may need to be monitored:  Often, but not continuously (intermittently).  All the time or for long periods at a time (continuously). Continuous monitoring may be needed if: ? You are taking certain medicines, such as medicine to relieve pain or make your contractions stronger. ? You have pregnancy or labor complications. Monitoring may be done by:  Placing a special stethoscope or a handheld monitoring device on your abdomen to check your baby's heartbeat and to check for contractions.  Placing monitors on your abdomen (external monitors) to record your baby's heartbeat and the frequency and length  of contractions.  Placing monitors inside your uterus through your vagina (internal monitors) to record your baby's heartbeat and the frequency, length, and strength of your contractions. Depending on the type of monitor, it may remain in your uterus or on your baby's head until birth.  Telemetry. This is a type of continuous monitoring that can be done with external or internal monitors. Instead of having to stay in bed, you are able to move around during telemetry. Physical exam Your health care provider may perform frequent physical exams. This may include:  Checking how  and where your baby is positioned in your uterus.  Checking your cervix to determine: ? Whether it is thinning out (effacing). ? Whether it is opening up (dilating). What happens during labor and delivery? Normal labor and delivery is divided into the following three stages: Stage 1  This is the longest stage of labor.  This stage can last for hours or days.  Throughout this stage, you will feel contractions. Contractions generally feel mild, infrequent, and irregular at first. They get stronger, more frequent (about every 2-3 minutes), and more regular as you move through this stage.  This stage ends when your cervix is completely dilated to 4 inches (10 cm) and completely effaced. Stage 2  This stage starts once your cervix is completely effaced and dilated and lasts until the delivery of your baby.  This stage may last from 20 minutes to 2 hours.  This is the stage where you will feel an urge to push your baby out of your vagina.  You may feel stretching and burning pain, especially when the widest part of your baby's head passes through the vaginal opening (crowning).  Once your baby is delivered, the umbilical cord will be clamped and cut. This usually occurs after waiting a period of 1-2 minutes after delivery.  Your baby will be placed on your bare chest (skin-to-skin contact) in an upright position and covered with a warm blanket. Watch your baby for feeding cues, like rooting or sucking, and help the baby to your breast for his or her first feeding. Stage 3  This stage starts immediately after the birth of your baby and ends after you deliver the placenta.  This stage may take anywhere from 5 to 30 minutes.  After your baby has been delivered, you will feel contractions as your body expels the placenta and your uterus contracts to control bleeding.   What can I expect after labor and delivery?  After labor is over, you and your baby will be monitored closely until you  are ready to go home to ensure that you are both healthy. Your health care team will teach you how to care for yourself and your baby.  You and your baby will stay in the same room (rooming in) during your hospital stay. This will encourage early bonding and successful breastfeeding.  You may continue to receive fluids and medicines through an IV.  Your uterus will be checked and massaged regularly (fundal massage).  You will have some soreness and pain in your abdomen, vagina, and the area of skin between your vaginal opening and your anus (perineum).  If an incision was made near your vagina (episiotomy) or if you had some vaginal tearing during delivery, cold compresses may be placed on your episiotomy or your tear. This helps to reduce pain and swelling.  You may be given a squirt bottle to use instead of wiping when you go to the bathroom. To use  the squirt bottle, follow these steps: ? Before you urinate, fill the squirt bottle with warm water. Do not use hot water. ? After you urinate, while you are sitting on the toilet, use the squirt bottle to rinse the area around your urethra and vaginal opening. This rinses away any urine and blood. ? Fill the squirt bottle with clean water every time you use the bathroom.  It is normal to have vaginal bleeding after delivery. Wear a sanitary pad for vaginal bleeding and discharge. Summary  Vaginal delivery means that you will give birth by pushing your baby out of your birth canal (vagina).  Your health care provider may monitor your contractions (uterine monitoring) and your baby's heart rate (fetal monitoring).  Your health care provider may perform a physical exam.  Normal labor and delivery is divided into three stages.  After labor is over, you and your baby will be monitored closely until you are ready to go home. This information is not intended to replace advice given to you by your health care provider. Make sure you discuss any  questions you have with your health care provider. Document Revised: 04/19/2017 Document Reviewed: 04/19/2017 Elsevier Patient Education  2021 [employer].

## 2020-08-30 ENCOUNTER — Inpatient Hospital Stay: Payer: 59 | Admitting: Anesthesiology

## 2020-08-30 ENCOUNTER — Encounter: Payer: Self-pay | Admitting: Advanced Practice Midwife

## 2020-08-30 ENCOUNTER — Inpatient Hospital Stay
Admission: EM | Admit: 2020-08-30 | Discharge: 2020-09-01 | DRG: 807 | Disposition: A | Discharge status: Home / Self Care | Payer: 59 | Attending: Obstetrics and Gynecology | Admitting: Obstetrics and Gynecology

## 2020-08-30 DIAGNOSIS — O98813 Other maternal infectious and parasitic diseases complicating pregnancy, third trimester: Secondary | ICD-10-CM

## 2020-08-30 DIAGNOSIS — O99824 Streptococcus B carrier state complicating childbirth: Secondary | ICD-10-CM | POA: Diagnosis present

## 2020-08-30 DIAGNOSIS — O99214 Obesity complicating childbirth: Secondary | ICD-10-CM | POA: Diagnosis present

## 2020-08-30 DIAGNOSIS — E669 Obesity, unspecified: Secondary | ICD-10-CM

## 2020-08-30 DIAGNOSIS — O99213 Obesity complicating pregnancy, third trimester: Secondary | ICD-10-CM | POA: Diagnosis not present

## 2020-08-30 DIAGNOSIS — Z3493 Encounter for supervision of normal pregnancy, unspecified, third trimester: Secondary | ICD-10-CM

## 2020-08-30 DIAGNOSIS — Z20822 Contact with and (suspected) exposure to covid-19: Secondary | ICD-10-CM | POA: Diagnosis present

## 2020-08-30 DIAGNOSIS — O26893 Other specified pregnancy related conditions, third trimester: Secondary | ICD-10-CM | POA: Diagnosis present

## 2020-08-30 DIAGNOSIS — Z3A39 39 weeks gestation of pregnancy: Secondary | ICD-10-CM | POA: Diagnosis not present

## 2020-08-30 DIAGNOSIS — B951 Streptococcus, group B, as the cause of diseases classified elsewhere: Secondary | ICD-10-CM

## 2020-08-30 LAB — RESP PANEL BY RT-PCR (FLU A&B, COVID) ARPGX2
Influenza A by PCR: NEGATIVE
Influenza B by PCR: NEGATIVE
SARS Coronavirus 2 by RT PCR: NEGATIVE

## 2020-08-30 LAB — TYPE AND SCREEN
ABO/RH(D): B POS
Antibody Screen: NEGATIVE

## 2020-08-30 LAB — CBC
HCT: 33.1 % — ABNORMAL LOW (ref 36.0–46.0)
Hemoglobin: 11.1 g/dL — ABNORMAL LOW (ref 12.0–15.0)
MCH: 29 pg (ref 26.0–34.0)
MCHC: 33.5 g/dL (ref 30.0–36.0)
MCV: 86.4 fL (ref 80.0–100.0)
Platelets: 234 10*3/uL (ref 150–400)
RBC: 3.83 MIL/uL — ABNORMAL LOW (ref 3.87–5.11)
RDW: 14.3 % (ref 11.5–15.5)
WBC: 10.9 10*3/uL — ABNORMAL HIGH (ref 4.0–10.5)
nRBC: 0 % (ref 0.0–0.2)

## 2020-08-30 MED ORDER — PHENYLEPHRINE 40 MCG/ML (10ML) SYRINGE FOR IV PUSH (FOR BLOOD PRESSURE SUPPORT)
80.0000 ug | PREFILLED_SYRINGE | INTRAVENOUS | Status: DC | PRN
  Filled 2020-08-30: qty 10

## 2020-08-30 MED ORDER — OXYTOCIN 10 UNIT/ML IJ SOLN
INTRAMUSCULAR | Status: AC
  Filled 2020-08-30: qty 2

## 2020-08-30 MED ORDER — FENTANYL 2.5 MCG/ML W/ROPIVACAINE 0.15% IN NS 100 ML EPIDURAL (ARMC)
EPIDURAL | Status: AC
  Filled 2020-08-30: qty 100

## 2020-08-30 MED ORDER — LIDOCAINE HCL (PF) 1 % IJ SOLN
INTRAMUSCULAR | Status: DC | PRN
  Administered 2020-08-30: 3 mL
  Administered 2020-08-30: 1 mL

## 2020-08-30 MED ORDER — LIDOCAINE-EPINEPHRINE (PF) 1.5 %-1:200000 IJ SOLN
INTRAMUSCULAR | Status: DC | PRN
  Administered 2020-08-30: 2 mL via PERINEURAL
  Administered 2020-08-30: 3 mL via PERINEURAL

## 2020-08-30 MED ORDER — LACTATED RINGERS IV SOLN: 500.0000 mL | INTRAVENOUS | Status: DC | PRN

## 2020-08-30 MED ORDER — SODIUM CHLORIDE 0.9 % IV SOLN
1.0000 g | Freq: Three times a day (TID) | INTRAVENOUS | Status: DC
  Administered 2020-08-30: 1 g via INTRAVENOUS
  Filled 2020-08-30 (×3): qty 10

## 2020-08-30 MED ORDER — CLINDAMYCIN PHOSPHATE 900 MG/50ML IV SOLN: 900.0000 mg | Freq: Three times a day (TID) | INTRAVENOUS | Status: DC

## 2020-08-30 MED ORDER — ACETAMINOPHEN 325 MG PO TABS: 650.0000 mg | ORAL_TABLET | ORAL | Status: DC | PRN

## 2020-08-30 MED ORDER — LIDOCAINE HCL (PF) 1 % IJ SOLN
30.0000 mL | INTRAMUSCULAR | Status: DC | PRN
  Filled 2020-08-30: qty 30

## 2020-08-30 MED ORDER — EPHEDRINE 5 MG/ML INJ
10.0000 mg | INTRAVENOUS | Status: DC | PRN
  Filled 2020-08-30: qty 2

## 2020-08-30 MED ORDER — BUTORPHANOL TARTRATE 1 MG/ML IJ SOLN: 1.0000 mg | INTRAMUSCULAR | Status: DC | PRN

## 2020-08-30 MED ORDER — ONDANSETRON HCL 4 MG/2ML IJ SOLN
4.0000 mg | Freq: Four times a day (QID) | INTRAMUSCULAR | Status: DC | PRN
  Administered 2020-08-30: 4 mg via INTRAVENOUS
  Filled 2020-08-30: qty 2

## 2020-08-30 MED ORDER — LACTATED RINGERS IV SOLN: 500.0000 mL | Freq: Once | INTRAVENOUS | Status: DC

## 2020-08-30 MED ORDER — DIPHENHYDRAMINE HCL 50 MG/ML IJ SOLN: 12.5000 mg | INTRAMUSCULAR | Status: DC | PRN

## 2020-08-30 MED ORDER — FENTANYL 2.5 MCG/ML W/ROPIVACAINE 0.15% IN NS 100 ML EPIDURAL (ARMC)
12.0000 mL/h | EPIDURAL | Status: DC
  Administered 2020-08-30: 12 mL/h via EPIDURAL
  Administered 2020-08-30: 10 mL/h via EPIDURAL
  Filled 2020-08-30: qty 100

## 2020-08-30 MED ORDER — TERBUTALINE SULFATE 1 MG/ML IJ SOLN: 0.2500 mg | Freq: Once | INTRAMUSCULAR | Status: DC | PRN

## 2020-08-30 MED ORDER — OXYTOCIN-SODIUM CHLORIDE 30-0.9 UT/500ML-% IV SOLN
2.5000 [IU]/h | INTRAVENOUS | Status: DC
  Administered 2020-08-31: 2.5 [IU]/h via INTRAVENOUS
  Filled 2020-08-30: qty 500

## 2020-08-30 MED ORDER — AMMONIA AROMATIC IN INHA
RESPIRATORY_TRACT | Status: AC
  Filled 2020-08-30: qty 10

## 2020-08-30 MED ORDER — OXYTOCIN BOLUS FROM INFUSION
333.0000 mL | Freq: Once | INTRAVENOUS | Status: AC
  Administered 2020-08-31: 333 mL via INTRAVENOUS

## 2020-08-30 MED ORDER — CEFAZOLIN SODIUM-DEXTROSE 2-4 GM/100ML-% IV SOLN
2.0000 g | Freq: Once | INTRAVENOUS | Status: AC
  Administered 2020-08-30: 2 g via INTRAVENOUS
  Filled 2020-08-30: qty 100

## 2020-08-30 MED ORDER — OXYTOCIN-SODIUM CHLORIDE 30-0.9 UT/500ML-% IV SOLN
1.0000 m[IU]/min | INTRAVENOUS | Status: DC
  Administered 2020-08-30: 2 m[IU]/min via INTRAVENOUS
  Filled 2020-08-30: qty 500

## 2020-08-30 MED ORDER — LACTATED RINGERS IV SOLN: INTRAVENOUS | Status: DC

## 2020-08-30 MED ORDER — MISOPROSTOL 200 MCG PO TABS
ORAL_TABLET | ORAL | Status: AC
  Filled 2020-08-30: qty 4

## 2020-08-30 NOTE — Progress Notes (Signed)
   Subjective:  Feeling contractions but comfortable  Objective:   Vitals: Blood pressure 124/75, pulse 69, temperature 98.5 F (36.9 C), temperature source Oral, resp. rate 18, height 5\' 2"  (1.575 m), weight 99.3 kg, last menstrual period 11/13/2019, SpO2 99 %. General: NAD Abdomen: gravid, non-tender Cervical Exam:  Dilation: 3.5 Effacement (%): 80 Cervical Position: Middle Station: -1 Presentation: Vertex Exam by:: Robin Searing RN AROM clear  FHT: 135, moderate, +accels, no decels Toco:q90min  Results for orders placed or performed during the hospital encounter of 08/30/20 (from the past 24 hour(s))  CBC     Status: Abnormal   Collection Time: 08/30/20 10:46 AM  Result Value Ref Range   WBC 10.9 (H) 4.0 - 10.5 K/uL   RBC 3.83 (L) 3.87 - 5.11 MIL/uL   Hemoglobin 11.1 (L) 12.0 - 15.0 g/dL   HCT 33.1 (L) 36.0 - 46.0 %   MCV 86.4 80.0 - 100.0 fL   MCH 29.0 26.0 - 34.0 pg   MCHC 33.5 30.0 - 36.0 g/dL   RDW 14.3 11.5 - 15.5 %   Platelets 234 150 - 400 K/uL   nRBC 0.0 0.0 - 0.2 %  Type and screen     Status: None   Collection Time: 08/30/20 10:46 AM  Result Value Ref Range   ABO/RH(D) B POS    Antibody Screen NEG    Sample Expiration      09/02/2020,2359 Performed at [facility], [address] [place],  [id number]   Resp Panel by RT-PCR (Flu A&B, Covid) Nasopharyngeal Swab     Status: None   Collection Time: 08/30/20 10:46 AM   Specimen: Nasopharyngeal Swab; Nasopharyngeal(NP) swabs in vial transport medium  Result Value Ref Range   SARS Coronavirus 2 by RT PCR NEGATIVE NEGATIVE   Influenza A by PCR NEGATIVE NEGATIVE   Influenza B by PCR NEGATIVE NEGATIVE    Assessment:   35 y.o. G1P0 [redacted]w[redacted]d elective IOL  Plan:   1) Labor - continue to titrate pitocin, AROM clear  2) Fetus - cat I tracing  Malachy Mood, MD, [place], [facility] 08/30/2020, 2:36 PM

## 2020-08-30 NOTE — H&P (Addendum)
Obstetric H&P   Chief Complaint: Elective IOL  Prenatal Care Provider: [facility]  History of Present Illness: 35 y.o. G1P0 [redacted]w[redacted]d by 09/01/2020, by Ultrasound presenting to L&D for elective IOL.  Pregnancy notable for obesity BMI 40.6, abnormal early 1-hr with normal 3-hr, 28 week 3-hr 69, 186, 126, 99, and GBS positive clindamycin sensitive.   +FM, no LOF, no VB, no contractions  Pregravid weight 89.4 kg Total Weight Gain 9.979 kg  pregnancy 1 Problems (from 11/13/19 to present)    Problem Noted Resolved   Supervision of low-risk pregnancy, third trimester 01/24/2020 by Gae Dry, MD No   Overview Addendum 08/15/2020  4:47 PM by Orlie Pollen, CNM     Nursing Staff Provider  Office Location  [facility] Dating   8wk [place]  Language  English Anatomy US   complete  Flu Vaccine    Genetic Screen  NIPS: declined   TDaP vaccine   07/24/20 Hgb A1C or  GTT Early: 141 Early 3h: 74, 132, 109, 9 Third trimester: passed 3 h  Rhogam   n/a   LAB RESULTS   Feeding Plan  breast Blood Type B/Positive/-- (10/12 1141)   Contraception  POP Antibody Negative (10/12 1141)  Circumcision  Rubella 3.98 (10/12 1141)  Pediatrician   RPR Non Reactive (10/12 1141)   Support Person  husband "[name]" HBsAg Negative (10/12 1141)   Prenatal Classes  information provided HIV Non Reactive (10/12 1141)    Varicella  immune  BTL Consent  n/a GBS   Positive       VBAC Consent  n/a Pap  10/21 NILM    Hgb Electro   n/a    CF      SMA               Previous Version       Review of Systems: 10 point review of systems negative unless otherwise noted in HPI  Past Medical History: Patient Active Problem List   Diagnosis Date Noted  . Labor and delivery, indication for care 08/15/2020  . Elevated blood pressure affecting pregnancy in third trimester, antepartum   . [redacted] weeks gestation of pregnancy   . Abnormal glucose affecting pregnancy 03/06/2020  . Obesity affecting pregnancy in first trimester 03/06/2020  .  Supervision of low-risk pregnancy, third trimester 01/24/2020     Nursing Staff Provider  Office Location  [facility] Dating   8wk [place]  Language  English Anatomy US   complete  Flu Vaccine    Genetic Screen  NIPS: declined   TDaP vaccine   07/24/20 Hgb A1C or  GTT Early: 141 Early 3h: 74, 132, 109, 87 Third trimester: passed 3 h  Rhogam   n/a   LAB RESULTS   Feeding Plan  breast Blood Type B/Positive/-- (10/12 1141)   Contraception  POP Antibody Negative (10/12 1141)  Circumcision  Rubella 3.98 (10/12 1141)  Pediatrician   RPR Non Reactive (10/12 1141)   Support Person  husband "[name]" HBsAg Negative (10/12 1141)   Prenatal Classes  information provided HIV Non Reactive (10/12 1141)    Varicella  immune  BTL Consent  n/a GBS   Positive       VBAC Consent  n/a Pap  10/21 NILM    Hgb Electro   n/a    CF      SMA              Past Surgical History: Past Surgical History:  Procedure Laterality Date  .  TONSILLECTOMY    . TYMPANOSTOMY TUBE PLACEMENT    . WISDOM TOOTH EXTRACTION      Past Obstetric History: # 1 - Date: None, Sex: None, Weight: None, GA: None, Delivery: None, Apgar1: None, Apgar5: None, Living: None, Birth Comments: None   Past Gynecologic History:  Family History: Family History  Problem Relation Age of Onset  . Kidney cancer Mother   . High blood pressure Mother   . Cancer Mother        Neuroendocrine cancer  . Healthy Father     Social History: Social History   Socioeconomic History  . Marital status: Married    Spouse name: Not on file  . Number of children: Not on file  . Years of education: Not on file  . Highest education level: Not on file  Occupational History  . Not on file  Tobacco Use  . Smoking status: Never Smoker  . Smokeless tobacco: Never Used  Vaping Use  . Vaping Use: Never used  Substance and Sexual Activity  . Alcohol use: Yes    Comment: social  . Drug use: Never  . Sexual activity: Yes  Other Topics Concern  . Not  on file  Social History Narrative  . Not on file   Social Determinants of Health   Financial Resource Strain: Not on file  Food Insecurity: Not on file  Transportation Needs: Not on file  Physical Activity: Not on file  Stress: Not on file  Social Connections: Not on file  Intimate Partner Violence: Not on file    Medications: Prior to Admission medications   Medication Sig Start Date End Date Taking? Authorizing Provider  citalopram (CELEXA) 10 MG tablet SMARTSIG:3 Tablet(s) By Mouth Every Other Day PRN 04/23/19   [provider]  docusate sodium (COLACE) 100 MG capsule Take 100 mg by mouth 2 (two) times daily.    [provider]  ondansetron (ZOFRAN-ODT) 4 MG disintegrating tablet TAKE 1 TABLET BY MOUTH EVERY 6 HOURS AS NEEDED FOR NAUSEA 04/18/20   Gae Dry, MD  Prenatal Vit-Fe Fumarate-FA (PRENATAL PO) Take 1 tablet by mouth daily.    [provider]  Probiotic Product (PROBIOTIC PO) Take by mouth daily.    [provider]    Allergies: Allergies  Allergen Reactions  . Penicillins     Other reaction(s): Other (See Comments), Unknown Childhood allergy unsure of reaction but can take cephalosporins      Physical Exam: Vitals: Last menstrual period 11/13/2019.  Urine Dip Protein: N/A  FHT: 145, moderate, +accels, on potential late in first minute on monitor  Toco: none  General: NAD HEENT: normocephalic, anicteric Pulmonary: No increased work of breathing Cardiovascular: RRR, distal pulses 2+ Abdomen: Gravid, non-tender [place]:vtx Genitourinary: 2.5cm on clinic exam Extremities: no edema, erythema, or tenderness Neurologic: Grossly intact Psychiatric: mood appropriate, affect full  Labs: No results found for this or any previous visit (from the past 24 hour(s)).  Assessment: 35 y.o. G1P0 [redacted]w[redacted]d by 09/01/2020, by Ultrasound presenting for scheduled elective IOL  Plan: 1) IOL - will start IOL with PCN given cervical  dilation.  The ARRIVE study was a national multicenter trial that randomized 6,106 patients to induction of labor at 39 weeks 0 days to 39 weeks 4 days (3,062) compared to expectant management (3,044).  There was no significant difference in adverse perinatal outcomes but there was a significantly lower rate of cesarean delivery, as well as lower rate of maternal hypertensive complications in the induction group.  Number to treat was calculated as 28 induction of labor to prevent 1 primary Cesarean section.  "Labor Induction versus Expectant Management in Nulliparous Low-Risk Women" The [place] of Medicine iAugust 9, 2018 Vol. 379 No. 6   2) Fetus - cat I tracing other than one small deceleration noted on initial placement on monitor  3) PNL - Blood type B/Positive/-- (10/12 1141) / Anti-bodyscreen Negative (10/12 1141) / Rubella 3.98 (10/12 1141) / Varicella Immune/ RPR Non Reactive (03/10 0926) / HBsAg Negative (10/12 1141) / HIV Non Reactive (03/10 0926) /  GBS Positive/-- (05/13 1344)  - unknown PCN allergy as child, has taken Kelfex in adulthood - Ancef for GBS ppx as opposed to clindamycin given risk for C-dif lower with ancef  4) Immunization History -  Immunization History  Administered Date(s) Administered  . Tdap 07/24/2020    5) Disposition - pending delivery  Malachy Mood, MD, [place], [place] 08/30/2020, 10:49 AM

## 2020-08-30 NOTE — Anesthesia Procedure Notes (Signed)
Epidural Patient location during procedure: OB Start time: 08/30/2020 2:32 PM  Staffing Anesthesiologist: Harrie Foreman, MD Performed: anesthesiologist   Epidural Patient position: sitting Location: L2-L3 Injection technique: LOR air  Needle:  Needle type: Tuohy  Needle gauge: 17 G Needle length: 9 cm Needle insertion depth: 6 cm Catheter type: closed end flexible Catheter size: 19 Gauge Catheter at skin depth: 12 cm  Additional Notes 1st attempt- L3/4 +LOR 7cm, but unable to thread catheter.  Needle adjusted slightly and +CSF.  Pt made aware of headache risk and treatment options, including fluids, caffeine and epidural blood patch. 2nd attempt: L2/3. +LOR 6cm, catheter threaded easily to 15cm and pulled back to 12cm.  Negative aspiration, negative CSF. Negative test dose.  Pt had slight headache prior to leaving room and was educated that anesthesia would round on patient tomorrow and discuss treatment options at that time, but an EBP was not as effective within the first 24hr of dural puncture.

## 2020-08-30 NOTE — Anesthesia Preprocedure Evaluation (Signed)
Anesthesia Evaluation  Patient identified by MRN, date of birth, ID band Patient awake    Airway Mallampati: II       Dental   Pulmonary           Cardiovascular  Rhythm:Regular     Neuro/Psych    GI/Hepatic   Endo/Other  Morbid obesityBMI 40  Renal/GU      Musculoskeletal   Abdominal   Peds  Hematology   Anesthesia Other Findings   Reproductive/Obstetrics (+) Pregnancy G1 @ [redacted]w[redacted]d                             Anesthesia Physical Anesthesia Plan  ASA: III  Anesthesia Plan: Epidural   Post-op Pain Management:    Induction:   PONV Risk Score and Plan:   Airway Management Planned:   Additional Equipment:   Intra-op Plan:   Post-operative Plan:   Informed Consent:   Plan Discussed with:   Anesthesia Plan Comments:         Anesthesia Quick Evaluation

## 2020-08-31 ENCOUNTER — Encounter: Payer: Self-pay | Admitting: Advanced Practice Midwife

## 2020-08-31 DIAGNOSIS — O99214 Obesity complicating childbirth: Secondary | ICD-10-CM

## 2020-08-31 LAB — CBC
HCT: 33.3 % — ABNORMAL LOW (ref 36.0–46.0)
Hemoglobin: 11.2 g/dL — ABNORMAL LOW (ref 12.0–15.0)
MCH: 29 pg (ref 26.0–34.0)
MCHC: 33.6 g/dL (ref 30.0–36.0)
MCV: 86.3 fL (ref 80.0–100.0)
Platelets: 226 10*3/uL (ref 150–400)
RBC: 3.86 MIL/uL — ABNORMAL LOW (ref 3.87–5.11)
RDW: 14.6 % (ref 11.5–15.5)
WBC: 16.2 10*3/uL — ABNORMAL HIGH (ref 4.0–10.5)
nRBC: 0 % (ref 0.0–0.2)

## 2020-08-31 LAB — RPR: RPR Ser Ql: NONREACTIVE

## 2020-08-31 MED ORDER — ONDANSETRON HCL 4 MG PO TABS: 4.0000 mg | ORAL_TABLET | ORAL | Status: DC | PRN

## 2020-08-31 MED ORDER — ACETAMINOPHEN 325 MG PO TABS
650.0000 mg | ORAL_TABLET | ORAL | Status: DC | PRN
  Administered 2020-08-31: 650 mg via ORAL
  Filled 2020-08-31: qty 2

## 2020-08-31 MED ORDER — ONDANSETRON HCL 4 MG/2ML IJ SOLN: 4.0000 mg | INTRAMUSCULAR | Status: DC | PRN

## 2020-08-31 MED ORDER — BENZOCAINE-MENTHOL 20-0.5 % EX AERO
1.0000 "application " | INHALATION_SPRAY | CUTANEOUS | Status: DC | PRN
  Administered 2020-08-31: 1 via TOPICAL
  Filled 2020-08-31: qty 56

## 2020-08-31 MED ORDER — OXYCODONE-ACETAMINOPHEN 5-325 MG PO TABS: 2.0000 | ORAL_TABLET | ORAL | Status: DC | PRN

## 2020-08-31 MED ORDER — IBUPROFEN 600 MG PO TABS
600.0000 mg | ORAL_TABLET | Freq: Four times a day (QID) | ORAL | Status: DC
  Administered 2020-08-31 – 2020-09-01 (×5): 600 mg via ORAL
  Filled 2020-08-31 (×7): qty 1

## 2020-08-31 MED ORDER — DIBUCAINE (PERIANAL) 1 % EX OINT: 1.0000 "application " | TOPICAL_OINTMENT | CUTANEOUS | Status: DC | PRN

## 2020-08-31 MED ORDER — DIPHENHYDRAMINE HCL 25 MG PO CAPS: 25.0000 mg | ORAL_CAPSULE | Freq: Four times a day (QID) | ORAL | Status: DC | PRN

## 2020-08-31 MED ORDER — OXYCODONE-ACETAMINOPHEN 5-325 MG PO TABS: 1.0000 | ORAL_TABLET | ORAL | Status: DC | PRN

## 2020-08-31 MED ORDER — SIMETHICONE 80 MG PO CHEW: 80.0000 mg | CHEWABLE_TABLET | ORAL | Status: DC | PRN

## 2020-08-31 MED ORDER — SENNOSIDES-DOCUSATE SODIUM 8.6-50 MG PO TABS
2.0000 | ORAL_TABLET | ORAL | Status: DC
  Administered 2020-08-31 – 2020-09-01 (×2): 2 via ORAL
  Filled 2020-08-31 (×2): qty 2

## 2020-08-31 MED ORDER — WITCH HAZEL-GLYCERIN EX PADS: 1.0000 "application " | MEDICATED_PAD | CUTANEOUS | Status: DC | PRN

## 2020-08-31 MED ORDER — CAFFEINE-SODIUM BENZOATE 125-125 MG/ML IJ SOLN
500.0000 mg | Freq: Every day | INTRAMUSCULAR | Status: DC | PRN
  Filled 2020-08-31: qty 2

## 2020-08-31 MED ORDER — COCONUT OIL OIL: 1.0000 "application " | TOPICAL_OIL | Status: DC | PRN

## 2020-08-31 MED ORDER — PRENATAL MULTIVITAMIN CH
1.0000 | ORAL_TABLET | Freq: Every day | ORAL | Status: DC
  Administered 2020-08-31: 1 via ORAL
  Filled 2020-08-31: qty 1

## 2020-08-31 NOTE — Lactation Note (Addendum)
This note was copied from a baby's chart. Lactation Consultation Note  Patient Name: Rhonda Yates [id number] Date: 08/31/2020 Reason for consult: Initial assessment;Mother's request;Difficult latch;Other (Comment) (Mom strugling to latch on her own) Age:33 hours  Maternal Data Has patient been taught Hand Expression?: Yes Does the patient have breastfeeding experience prior to this delivery?: No (First baby)  Feeding Mother's Current Feeding Choice: Breast Milk  Mom has been struggling to get Rhonda Yates to latch.  He has not breast feed in 5 hours.  He is demonstrating some subtle feeding cues when moved which were pointed out to parents.  Assisted mom with comfortable position with pillow support with Rhonda Yates in football hold on right breast skin to skin.  Demonstrated hand expression of some colostrum to entice him to latch.  Mom has a small skin tag at base of nipple on right which should not interfere with breast feeding.  At rest mom's nipple looked a little flat, but everts easily when compressed.  Demonstrated how to sandwich breast for deeper latch.  At first, he was only getting a shallow latch and losing the seal on the breast.  Once we finally got the breast far enough in his mouth, he was able to sustain the latch for 20 minutes with strong, rhythmic sucking and good swallows.  Encouraged mom to keep Rhonda Yates close to the breast with nose and chin touching to maintain the latch and occasionally massage breast to keep him actively sucking.  Mom felt more confident by the end of the feeding.  Mom denies any breast or nipple pain.  As new parents, they had lots of questions which were addressed.  Mom has [employer].  Mom has a Medela pump at home.  Hand out given on what to expect the first 4 days of breast feeding.  Discussed newborn stomach size, adequate intake and out put, when and how to begin pumping for storage to return to work, storage and collection of breast milk, when to introduce  the bottle, how to know he is getting enough milk from her, supply and demand, normal course of lactation and routine newborn feeding patterns.  Lactation Health Net given and reviewed support groups, [employer], informational web sites and contact numbers.  Lactation name and number written on white board and encouraged to call with any questions, concerns or assistance. LATCH Score Latch: Repeated attempts needed to sustain latch, nipple held in mouth throughout feeding, stimulation needed to elicit sucking reflex.  Audible Swallowing: A few with stimulation  Type of Nipple: Flat (Everts with compression)  Comfort (Breast/Nipple): Soft / non-tender  Hold (Positioning): Assistance needed to correctly position infant at breast and maintain latch.  LATCH Score: 6   Lactation Tools Discussed/Used    Interventions Interventions: Breast feeding basics reviewed;Assisted with latch;Skin to skin;Breast massage;Hand express;Reverse pressure;Breast compression;Adjust position;Support pillows;Position options;Education  Discharge Discharge Education: Engorgement and breast care;Warning signs for feeding baby;Other (comment) Pump: Personal (Has Medela DEBP) WIC Program: No ([place])  Consult Status Consult Status: Follow-up Date: 08/31/20 Follow-up type: Call as needed    Jarold Motto 08/31/2020, 9:51 AM

## 2020-08-31 NOTE — Discharge Summary (Signed)
OB Discharge Summary     Patient Name: Rhonda Yates DOB: February 21, 1986 MRN: [id number]  Date of admission: 08/30/2020 Delivering MD: Malachy Mood   Date of discharge: 09/01/2020  Admitting diagnosis: Labor and delivery, indication for care [O75.9] Intrauterine pregnancy: [redacted]w[redacted]d     Secondary diagnosis:  Active Problems:   Labor and delivery, indication for care   Postpartum care following vaginal delivery   Encounter for care or examination of lactating mother  Additional problems: none     Discharge diagnosis: Term Pregnancy Delivered                                                                                                Post partum procedures:none  Augmentation: AROM and Pitocin  Complications: None  Hospital course:  Induction of Labor With Vaginal Delivery   35 y.o. yo G1P0 at [redacted]w[redacted]d was admitted to the hospital 08/30/2020 for induction of labor.  Indication for induction: Elective.  Patient had an uncomplicated labor course as follows: Membrane Rupture Time/Date: 2:20 PM ,08/30/2020   Delivery Method:Vaginal, Spontaneous  Episiotomy: None  Lacerations:  None  Details of delivery can be found in separate delivery note.  Patient had a routine postpartum course. Patient is discharged home 09/01/20.  Newborn Data: Birth date:08/31/2020  Birth time:12:46 AM  Gender:Female  Living status:Living  Apgars:6 ,8  Weight:3660 g   Physical exam  Vitals:   08/31/20 0819 08/31/20 1202 08/31/20 1604 09/01/20 0830  BP: (!) 144/85 113/76 114/75 133/90  Pulse: (!) 54 62 64 61  Resp: 20 18 18 20   Temp: 98.9 F (37.2 C) 98 F (36.7 C) 98.9 F (37.2 C) 98.4 F (36.9 C)  TempSrc: Oral  Oral Oral  SpO2: 100% 99% 99% 98%  Weight:      Height:       General: alert, cooperative and no distress Lochia: appropriate Uterine Fundus: firm Incision: N/A DVT Evaluation: No evidence of DVT seen on physical exam. Labs: Lab Results  Component Value Date   WBC 16.2 (H)  08/31/2020   HGB 11.2 (L) 08/31/2020   HCT 33.3 (L) 08/31/2020   MCV 86.3 08/31/2020   PLT 226 08/31/2020   CMP Latest Ref Rng & Units 08/15/2020  Glucose 70 - 99 mg/dL 94  BUN 6 - 20 mg/dL 15  Creatinine 0.44 - 1.00 mg/dL 0.57  Sodium 135 - 145 mmol/L 135  Potassium 3.5 - 5.1 mmol/L 3.7  Chloride 98 - 111 mmol/L 104  CO2 22 - 32 mmol/L 24  Calcium 8.9 - 10.3 mg/dL 8.5(L)  Total Protein 6.5 - 8.1 g/dL 6.5  Total Bilirubin 0.3 - 1.2 mg/dL 0.8  Alkaline Phos 38 - 126 U/L 88  AST 15 - 41 U/L 19  ALT 0 - 44 U/L 9    Discharge instruction: per After Visit Summary and "Baby and Me Booklet".  After visit meds:  Allergies as of 09/01/2020      Reactions   Penicillins    Other reaction(s): Other (See Comments), Unknown Childhood allergy unsure of reaction but can take cephalosporins  Medication List    STOP taking these medications   ondansetron 4 MG disintegrating tablet Commonly known as: ZOFRAN-ODT     TAKE these medications   citalopram 10 MG tablet Commonly known as: CELEXA SMARTSIG:3 Tablet(s) By Mouth Every Other Day PRN   docusate sodium 100 MG capsule Commonly known as: COLACE Take 100 mg by mouth 2 (two) times daily.   norethindrone 0.35 MG tablet Commonly known as: MICRONOR Take 1 tablet (0.35 mg total) by mouth daily. Start taking on: September 28, 2020   PRENATAL PO Take 1 tablet by mouth daily.   PROBIOTIC PO Take by mouth daily.       Diet: routine diet  Activity: Advance as tolerated. Pelvic rest for 6 weeks.   Outpatient follow up:6 weeks Follow up Appt: Future Appointments  Date Time Provider [place]  09/04/2020  3:30 PM Rod Can, CNM [facility] None   Follow up Visit:No follow-ups on file.  Postpartum contraception: Progesterone only pills  Newborn Data: Live born female  Birth Weight: 8 lb 1.1 oz (3660 g) APGAR: 6, 8  Newborn Delivery   Birth date/time: 08/31/2020 00:46:00 Delivery type: Vaginal, Spontaneous      Baby  Feeding: Breast Disposition:home with mother   09/01/2020 Rod Can, CNM

## 2020-08-31 NOTE — Anesthesia Postprocedure Evaluation (Signed)
Anesthesia Post Note  Patient: Rhonda Yates  Procedure(s) Performed: AN AD HOC LABOR EPIDURAL  Patient location during evaluation: Mother Baby Anesthesia Type: Epidural Level of consciousness: awake, awake and alert and oriented Pain management: pain level controlled Vital Signs Assessment: post-procedure vital signs reviewed and stable Respiratory status: spontaneous breathing, nonlabored ventilation and respiratory function stable Cardiovascular status: blood pressure returned to baseline and stable Postop Assessment: no headache and no backache Anesthetic complications: yes Comments: S/P wet tap by Dr Martyn Ehrich. Denies headache at this time   No complications documented.   Last Vitals:  Vitals:   08/31/20 0819 08/31/20 1202  BP: (!) 144/85 113/76  Pulse: (!) 54 62  Resp: 20 18  Temp: 37.2 C 36.7 C  SpO2: 100% 99%    Last Pain:  Vitals:   08/31/20 0830  TempSrc:   PainSc: 0-No pain                 [employer]

## 2020-09-01 MED ORDER — NORETHINDRONE 0.35 MG PO TABS: 1.0000 | ORAL_TABLET | Freq: Every day | ORAL | 3 refills | Status: DC

## 2020-09-01 NOTE — Progress Notes (Signed)
Mother discharged.  Discharge instructions given.  Mother verbalizes understanding.  Transported by auxiliary.

## 2020-09-01 NOTE — Discharge Instructions (Signed)

## 2020-09-04 ENCOUNTER — Encounter: Payer: 59 | Admitting: Advanced Practice Midwife

## 2020-09-15 ENCOUNTER — Telehealth: Payer: Self-pay

## 2020-09-15 NOTE — Telephone Encounter (Signed)
Pt calling, hsb's work needs note that he had a baby.  [phone]  Pt states the dates need to be from 08/31/20 to 09/21/20. Adv to look for it in [place].

## 2020-10-14 ENCOUNTER — Other Ambulatory Visit: Payer: Self-pay

## 2020-10-14 ENCOUNTER — Ambulatory Visit (INDEPENDENT_AMBULATORY_CARE_PROVIDER_SITE_OTHER): Payer: 59 | Admitting: Obstetrics and Gynecology

## 2020-10-14 ENCOUNTER — Encounter: Payer: Self-pay | Admitting: Obstetrics and Gynecology

## 2020-10-14 MED ORDER — NORETHINDRONE 0.35 MG PO TABS: 1.0000 | ORAL_TABLET | Freq: Every day | ORAL | 3 refills | Status: DC

## 2020-10-14 NOTE — Progress Notes (Signed)
Postpartum Visit  Chief Complaint:  Chief Complaint  Patient presents with   Post-op Follow-up    6 wk postpartum - no concerns. RM 5    History of Present Illness: Patient is a 35 y.o. G1P1001 presents for postpartum visit.  Date of delivery: 08/31/2020 Type of delivery: Vaginal delivery - Vacuum or forceps assisted  no Episiotomy No.  Laceration: no  Pregnancy or labor problems:  no Any problems since the delivery:  no  Newborn Details:  SINGLETON :  1. BabyGender Female Birth weight: 8lbs 1.1oz Maternal Details:  Breast or formula feeding: plans to breastfeed Intercourse: No  Contraception after delivery: No  Any bowel or bladder issues: No  Post partum depression/anxiety noted:  no Edinburgh Post-Partum Depression Score:10 Date of last PAP: 10/12/2021NIL and HR HPV negative   Review of Systems: Review of Systems  Constitutional: Negative.   Gastrointestinal: Negative.   Genitourinary: Negative.    The following portions of the patient's history were reviewed and updated as appropriate: allergies, current medications, past family history, past medical history, past social history, past surgical history, and problem list.  Past Medical History:  Past Medical History:  Diagnosis Date   Anxiety     Past Surgical History:  Past Surgical History:  Procedure Laterality Date   TONSILLECTOMY     TYMPANOSTOMY TUBE PLACEMENT     WISDOM TOOTH EXTRACTION      Family History:  Family History  Problem Relation Age of Onset   Kidney cancer Mother    High blood pressure Mother    Cancer Mother        Neuroendocrine cancer   Healthy Father     Social History:  Social History   Socioeconomic History   Marital status: Married    Spouse name: Not on file   Number of children: Not on file   Years of education: Not on file   Highest education level: Not on file  Occupational History   Not on file  Tobacco Use   Smoking status: Never   Smokeless tobacco: Never   Vaping Use   Vaping Use: Never used  Substance and Sexual Activity   Alcohol use: Yes    Comment: social   Drug use: Never   Sexual activity: Yes    Birth control/protection: Pill  Other Topics Concern   Not on file  Social History Narrative   Not on file   Social Determinants of Health   Financial Resource Strain: Not on file  Food Insecurity: Not on file  Transportation Needs: Not on file  Physical Activity: Not on file  Stress: Not on file  Social Connections: Not on file  Intimate Partner Violence: Not on file    Allergies:  Allergies  Allergen Reactions   Penicillins     Other reaction(s): Other (See Comments), Unknown Childhood allergy unsure of reaction but can take cephalosporins      Medications: Prior to Admission medications   Medication Sig Start Date End Date Taking? Authorizing Provider  citalopram (CELEXA) 10 MG tablet SMARTSIG:3 Tablet(s) By Mouth Every Other Day PRN 04/23/19  Yes [provider]  docusate sodium (COLACE) 100 MG capsule Take 100 mg by mouth 2 (two) times daily.   Yes [provider]  Prenatal Vit-Fe Fumarate-FA (PRENATAL PO) Take 1 tablet by mouth daily.   Yes [provider]  Probiotic Product (PROBIOTIC PO) Take by mouth daily.   Yes [provider]  norethindrone (MICRONOR) 0.35 MG tablet Take  1 tablet (0.35 mg total) by mouth daily. 09/28/20   Rod Can, CNM    Physical Exam Blood pressure 112/72, height 5\' 2"  (1.575 m), weight 175 lb (79.4 kg), unknown if currently breastfeeding.    General: NAD HEENT: normocephalic, anicteric Pulmonary: No increased work of breathing Abdomen: NABS, soft, non-tender, non-distended.  Umbilicus without lesions.  No hepatomegaly, splenomegaly or masses palpable. No evidence of hernia. Genitourinary:  External: Normal external female genitalia.  Normal urethral meatus, normal  Bartholin's and Skene's glands.    Vagina: Normal vaginal mucosa, no evidence of  prolapse.    Cervix: Grossly normal in appearance, no bleeding  Uterus: Non-enlarged, mobile, normal contour.  No CMT  Adnexa: ovaries non-enlarged, no adnexal masses  Rectal: deferred Extremities: no edema, erythema, or tenderness Neurologic: Grossly intact Psychiatric: mood appropriate, affect full    Edinburgh Postnatal Depression Scale - 10/14/20 1640       Edinburgh Postnatal Depression Scale:  In the Past 7 Days   I have been able to laugh and see the funny side of things. 0    I have looked forward with enjoyment to things. 0    I have blamed myself unnecessarily when things went wrong. 2    I have been anxious or worried for no good reason. 2    I have felt scared or panicky for no good reason. 2    Things have been getting on top of me. 2    I have been so unhappy that I have had difficulty sleeping. 0    I have felt sad or miserable. 1    I have been so unhappy that I have been crying. 1    The thought of harming myself has occurred to me. 0    Edinburgh Postnatal Depression Scale Total 10              Assessment: 35 y.o. G1P1001 presenting for 6 week postpartum visit  Plan: Problem List Items Addressed This Visit   None    1) Contraception - Education given regarding options for contraception, as well as compatibility with breast feeding if applicable.  Patient plans on OCP (estrogen/progesterone) for contraception.  2)  Pap - ASCCP guidelines and rational discussed.  ASCCP guidelines and rational discussed.  Patient opts for every 3 years screening interval  3) Patient underwent screening for postpartum depression with no signs of depression - con't citalopram  4) Return in about 1 year (around 10/14/2021) for annual.   Malachy Mood, MD, [place], [facility] 10/14/2020, 2:05 PM

## 2020-11-30 ENCOUNTER — Ambulatory Visit: Admit: 2020-11-30 | Payer: 59 | Source: Home / Self Care

## 2020-11-30 ENCOUNTER — Other Ambulatory Visit: Payer: Self-pay

## 2020-11-30 ENCOUNTER — Ambulatory Visit
Admission: EM | Admit: 2020-11-30 | Discharge: 2020-11-30 | Disposition: A | Discharge status: Home / Self Care | Payer: BC Managed Care – PPO | Attending: Physician Assistant | Admitting: Physician Assistant

## 2020-11-30 DIAGNOSIS — L03039 Cellulitis of unspecified toe: Secondary | ICD-10-CM

## 2020-11-30 MED ORDER — SULFAMETHOXAZOLE-TRIMETHOPRIM 800-160 MG PO TABS: 1.0000 | ORAL_TABLET | Freq: Two times a day (BID) | ORAL | 0 refills | Status: AC

## 2020-11-30 NOTE — Discharge Instructions (Addendum)
You have a paronychia or pustular collection around the cuticle.  I have sent in antibiotics.  Continue with warm soaks.  Tylenol for discomfort.  Area should be looking better in the next couple of days.  If it is not or he develop increased swelling/redness or fevers need to be seen again.

## 2020-11-30 NOTE — ED Provider Notes (Signed)
[facility]    CSN: [id number] Arrival date & time: 11/30/20  1452      History   Chief Complaint No chief complaint on file.   HPI Rhonda Yates is a 35 y.o. female presenting for right great toe swelling, redness and pain over the past 2 weeks.  She says she had her toenails done right before the symptoms started.  She has had some pustular drainage around the cuticle.  She says she has been soaking the toe without any improvement in her symptoms.  She denies any fevers.  She states that her mother looked to the area and did not notice an ingrown toenail.  Patient has not taken anything for pain relief.  No numbness or tingling.  No history of recurrent skin infections or MRSA.  Patient is a breast-feeding mother.  She denies any health problems in her infant.  No other concerns.  HPI  Past Medical History:  Diagnosis Date   Anxiety     Patient Active Problem List   Diagnosis Date Noted   Encounter for care or examination of lactating mother 09/01/2020   Elevated blood pressure affecting pregnancy in third trimester, antepartum    Abnormal glucose affecting pregnancy 03/06/2020   Obesity affecting pregnancy in first trimester 03/06/2020    Past Surgical History:  Procedure Laterality Date   TONSILLECTOMY     TYMPANOSTOMY TUBE PLACEMENT     WISDOM TOOTH EXTRACTION      OB History     Gravida  1   Para  1   Term  1   Preterm      AB      Living  1      SAB      IAB      Ectopic      Multiple  0   Live Births  1            Home Medications    Prior to Admission medications   Medication Sig Start Date End Date Taking? Authorizing Provider  citalopram (CELEXA) 10 MG tablet SMARTSIG:3 Tablet(s) By Mouth Every Other Day PRN 04/23/19  Yes [provider]  docusate sodium (COLACE) 100 MG capsule Take 100 mg by mouth 2 (two) times daily.   Yes [provider]  norethindrone (MICRONOR) 0.35 MG tablet Take 1 tablet  (0.35 mg total) by mouth daily. 10/14/20  Yes Malachy Mood, MD  Prenatal Vit-Fe Fumarate-FA (PRENATAL PO) Take 1 tablet by mouth daily.   Yes [provider]  Probiotic Product (PROBIOTIC PO) Take by mouth daily.   Yes [provider]  sulfamethoxazole-trimethoprim (BACTRIM DS) 800-160 MG tablet Take 1 tablet by mouth 2 (two) times daily for 7 days. 11/30/20 12/07/20 Yes Danton Clap, PA-C    Family History Family History  Problem Relation Age of Onset   Kidney cancer Mother    High blood pressure Mother    Cancer Mother        Neuroendocrine cancer   Healthy Father     Social History Social History   Tobacco Use   Smoking status: Never   Smokeless tobacco: Never  Vaping Use   Vaping Use: Never used  Substance Use Topics   Alcohol use: Yes    Comment: social   Drug use: Never     Allergies   Penicillins   Review of Systems Review of Systems  Constitutional:  Negative for fatigue and fever.  Musculoskeletal:  Positive for joint swelling. Negative  for arthralgias.  Skin:  Positive for color change. Negative for wound.  Neurological:  Negative for weakness and numbness.    Physical Exam Triage Vital Signs ED Triage Vitals  Enc Vitals Group     BP 11/30/20 1510 112/70     Pulse Rate 11/30/20 1510 63     Resp 11/30/20 1510 16     Temp 11/30/20 1510 98.4 F (36.9 C)     Temp Source 11/30/20 1510 Oral     SpO2 11/30/20 1510 100 %     Weight 11/30/20 1503 184 lb (83.5 kg)     Height 11/30/20 1503 '5\' 2"'$  (1.575 m)     Head Circumference --      Peak Flow --      Pain Score 11/30/20 1503 0     Pain Loc --      Pain Edu? --      Excl. in [place]? --    No data found.  Updated Vital Signs BP 112/70 (BP Location: Left Arm)   Pulse 63   Temp 98.4 F (36.9 C) (Oral)   Resp 16   Ht '5\' 2"'$  (1.575 m)   Wt 184 lb (83.5 kg)   SpO2 100%   Breastfeeding Yes   BMI 33.65 kg/m      Physical Exam Vitals and nursing note reviewed.  Constitutional:       General: She is not in acute distress.    Appearance: Normal appearance. She is not ill-appearing or toxic-appearing.  HENT:     Head: Normocephalic and atraumatic.  Eyes:     General: No scleral icterus.       Right eye: No discharge.        Left eye: No discharge.     Conjunctiva/sclera: Conjunctivae normal.  Cardiovascular:     Rate and Rhythm: Normal rate and regular rhythm.     Pulses: Normal pulses.  Pulmonary:     Effort: Pulmonary effort is normal. No respiratory distress.  Musculoskeletal:     Cervical back: Neck supple.  Skin:    General: Skin is dry.     Comments: RIGHT GREAT TOE: There is moderate swelling and erythema of the distal to mid right great toe on the plantar and dorsal surfaces.  There is especially increased swelling around the lateral nail fold.  No pustular drainage expressed.  Tenderness to palpation throughout this area.  Good range of motion of this toe.  Neurological:     General: No focal deficit present.     Mental Status: She is alert. Mental status is at baseline.     Motor: No weakness.     Gait: Gait normal.  Psychiatric:        Mood and Affect: Mood normal.        Behavior: Behavior normal.        Thought Content: Thought content normal.     UC Treatments / Results  Labs (all labs ordered are listed, but only abnormal results are displayed) Labs Reviewed - No data to display  EKG   Radiology No results found.  Procedures Procedures (including critical care time)  Medications Ordered in UC Medications - No data to display  Initial Impression / Assessment and Plan / UC Course  I have reviewed the triage vital signs and the nursing notes.  Pertinent labs & imaging results that were available during my care of the patient were reviewed by me and considered in my medical decision making (see chart for details).  35 year old female presenting for redness and swelling of the right great toe over the past 2 weeks after having her  toenails done.  Clinical presentation is consistent with paronychia.  She has not had any improvement with warm soaks.  Will initiate oral antibiotic therapy with Bactrim DS.  Advised her To continue with the soaks and take Tylenol as needed for pain relief.  Advise close monitoring to follow-up with PCP or return here for any worsening of symptoms or if she is not improving with the medication as prescribed. ED precautions discussed.    Final Clinical Impressions(s) / UC Diagnoses   Final diagnoses:  Paronychia of great toe     Discharge Instructions      You have a paronychia or pustular collection around the cuticle.  I have sent in antibiotics.  Continue with warm soaks.  Tylenol for discomfort.  Area should be looking better in the next couple of days.  If it is not or he develop increased swelling/redness or fevers need to be seen again.   ED Prescriptions     Medication Sig Dispense Auth. Provider   sulfamethoxazole-trimethoprim (BACTRIM DS) 800-160 MG tablet Take 1 tablet by mouth 2 (two) times daily for 7 days. 14 tablet Gretta Cool      PDMP not reviewed this encounter.   Danton Clap, PA-C 11/30/20 1533

## 2020-11-30 NOTE — ED Triage Notes (Signed)
Pt here with C/O right great toe infection, states she can get puss out of it, it has been like this for 2 weeks.

## 2020-12-07 ENCOUNTER — Other Ambulatory Visit: Payer: Self-pay

## 2020-12-28 ENCOUNTER — Emergency Department: Payer: BC Managed Care – PPO

## 2020-12-28 ENCOUNTER — Other Ambulatory Visit: Payer: Self-pay

## 2020-12-28 ENCOUNTER — Encounter: Payer: Self-pay | Admitting: Emergency Medicine

## 2020-12-28 DIAGNOSIS — Z8051 Family history of malignant neoplasm of kidney: Secondary | ICD-10-CM

## 2020-12-28 DIAGNOSIS — K8062 Calculus of gallbladder and bile duct with acute cholecystitis without obstruction: Secondary | ICD-10-CM | POA: Diagnosis not present

## 2020-12-28 DIAGNOSIS — Z8616 Personal history of COVID-19: Secondary | ICD-10-CM

## 2020-12-28 DIAGNOSIS — Z6832 Body mass index (BMI) 32.0-32.9, adult: Secondary | ICD-10-CM

## 2020-12-28 DIAGNOSIS — Z20822 Contact with and (suspected) exposure to covid-19: Secondary | ICD-10-CM | POA: Diagnosis present

## 2020-12-28 DIAGNOSIS — Z79899 Other long term (current) drug therapy: Secondary | ICD-10-CM

## 2020-12-28 DIAGNOSIS — Z880 Allergy status to penicillin: Secondary | ICD-10-CM

## 2020-12-28 DIAGNOSIS — K8050 Calculus of bile duct without cholangitis or cholecystitis without obstruction: Secondary | ICD-10-CM | POA: Diagnosis not present

## 2020-12-28 DIAGNOSIS — R7401 Elevation of levels of liver transaminase levels: Secondary | ICD-10-CM | POA: Diagnosis present

## 2020-12-28 DIAGNOSIS — D1803 Hemangioma of intra-abdominal structures: Secondary | ICD-10-CM | POA: Diagnosis present

## 2020-12-28 DIAGNOSIS — E669 Obesity, unspecified: Secondary | ICD-10-CM | POA: Diagnosis present

## 2020-12-28 LAB — URINALYSIS, COMPLETE (UACMP) WITH MICROSCOPIC
Bacteria, UA: NONE SEEN
Bilirubin Urine: NEGATIVE
Glucose, UA: NEGATIVE mg/dL
Hgb urine dipstick: NEGATIVE
Ketones, ur: NEGATIVE mg/dL
Nitrite: NEGATIVE
Protein, ur: 100 mg/dL — AB
Specific Gravity, Urine: 1.028 (ref 1.005–1.030)
Squamous Epithelial / HPF: NONE SEEN (ref 0–5)
pH: 5 (ref 5.0–8.0)

## 2020-12-28 LAB — COMPREHENSIVE METABOLIC PANEL
ALT: 332 U/L — ABNORMAL HIGH (ref 0–44)
AST: 280 U/L — ABNORMAL HIGH (ref 15–41)
Albumin: 4.7 g/dL (ref 3.5–5.0)
Alkaline Phosphatase: 139 U/L — ABNORMAL HIGH (ref 38–126)
Anion gap: 10 (ref 5–15)
BUN: 17 mg/dL (ref 6–20)
CO2: 27 mmol/L (ref 22–32)
Calcium: 9.2 mg/dL (ref 8.9–10.3)
Chloride: 105 mmol/L (ref 98–111)
Creatinine, Ser: 0.78 mg/dL (ref 0.44–1.00)
GFR, Estimated: >60 mL/min (ref >60)
Glucose, Bld: 146 mg/dL — ABNORMAL HIGH (ref 70–99)
Potassium: 4.1 mmol/L (ref 3.5–5.1)
Sodium: 142 mmol/L (ref 135–145)
Total Bilirubin: 3.2 mg/dL — ABNORMAL HIGH (ref 0.3–1.2)
Total Protein: 7.6 g/dL (ref 6.5–8.1)

## 2020-12-28 LAB — CBC
HCT: 43 % (ref 36.0–46.0)
Hemoglobin: 14.2 g/dL (ref 12.0–15.0)
MCH: 28.8 pg (ref 26.0–34.0)
MCHC: 33 g/dL (ref 30.0–36.0)
MCV: 87.2 fL (ref 80.0–100.0)
Platelets: 349 10*3/uL (ref 150–400)
RBC: 4.93 MIL/uL (ref 3.87–5.11)
RDW: 12.1 % (ref 11.5–15.5)
WBC: 17 10*3/uL — ABNORMAL HIGH (ref 4.0–10.5)
nRBC: 0 % (ref 0.0–0.2)

## 2020-12-28 LAB — LIPASE, BLOOD: Lipase: 35 U/L (ref 11–51)

## 2020-12-28 LAB — POC URINE PREG, ED: Preg Test, Ur: NEGATIVE

## 2020-12-28 NOTE — ED Triage Notes (Signed)
Pt via POV from home. Pt c/o epigastric pain intermittently since Wednesday. Pt states she has some diarrhea. Denies NV. Denies fever. Denies abd surgeries. Denies urinary symptoms. Pt is A&OX4 and NAD.

## 2020-12-28 NOTE — ED Notes (Signed)
Pt's husband to desk, states patient now c/o L arm pain and tingling to her fingers. Pt's care discussed and chart reviewed with Dr. Archie Balboa, Windy Carina for RUQ [place] after review of labs.

## 2020-12-29 ENCOUNTER — Inpatient Hospital Stay
Admission: EM | Admit: 2020-12-29 | Discharge: 2021-01-01 | DRG: 419 | Disposition: A | Discharge status: Home / Self Care | Payer: BC Managed Care – PPO | Attending: Internal Medicine | Admitting: Internal Medicine

## 2020-12-29 ENCOUNTER — Observation Stay: Payer: BC Managed Care – PPO

## 2020-12-29 ENCOUNTER — Emergency Department: Payer: BC Managed Care – PPO

## 2020-12-29 DIAGNOSIS — D1803 Hemangioma of intra-abdominal structures: Secondary | ICD-10-CM | POA: Diagnosis present

## 2020-12-29 DIAGNOSIS — R7401 Elevation of levels of liver transaminase levels: Secondary | ICD-10-CM | POA: Diagnosis present

## 2020-12-29 DIAGNOSIS — K8050 Calculus of bile duct without cholangitis or cholecystitis without obstruction: Secondary | ICD-10-CM | POA: Diagnosis present

## 2020-12-29 DIAGNOSIS — K8020 Calculus of gallbladder without cholecystitis without obstruction: Secondary | ICD-10-CM | POA: Diagnosis not present

## 2020-12-29 DIAGNOSIS — Z20822 Contact with and (suspected) exposure to covid-19: Secondary | ICD-10-CM | POA: Diagnosis present

## 2020-12-29 DIAGNOSIS — K8042 Calculus of bile duct with acute cholecystitis without obstruction: Secondary | ICD-10-CM | POA: Diagnosis not present

## 2020-12-29 DIAGNOSIS — Z880 Allergy status to penicillin: Secondary | ICD-10-CM | POA: Diagnosis not present

## 2020-12-29 DIAGNOSIS — Z6832 Body mass index (BMI) 32.0-32.9, adult: Secondary | ICD-10-CM | POA: Diagnosis not present

## 2020-12-29 DIAGNOSIS — R1011 Right upper quadrant pain: Secondary | ICD-10-CM

## 2020-12-29 DIAGNOSIS — Z8051 Family history of malignant neoplasm of kidney: Secondary | ICD-10-CM | POA: Diagnosis not present

## 2020-12-29 DIAGNOSIS — K8062 Calculus of gallbladder and bile duct with acute cholecystitis without obstruction: Secondary | ICD-10-CM | POA: Diagnosis present

## 2020-12-29 DIAGNOSIS — Z789 Other specified health status: Secondary | ICD-10-CM | POA: Diagnosis not present

## 2020-12-29 DIAGNOSIS — R7989 Other specified abnormal findings of blood chemistry: Secondary | ICD-10-CM

## 2020-12-29 DIAGNOSIS — K8000 Calculus of gallbladder with acute cholecystitis without obstruction: Secondary | ICD-10-CM | POA: Diagnosis not present

## 2020-12-29 DIAGNOSIS — Z79899 Other long term (current) drug therapy: Secondary | ICD-10-CM | POA: Diagnosis not present

## 2020-12-29 DIAGNOSIS — Z8616 Personal history of COVID-19: Secondary | ICD-10-CM | POA: Diagnosis not present

## 2020-12-29 DIAGNOSIS — E669 Obesity, unspecified: Secondary | ICD-10-CM | POA: Diagnosis present

## 2020-12-29 LAB — COMPREHENSIVE METABOLIC PANEL
ALT: 927 U/L — ABNORMAL HIGH (ref 0–44)
AST: 949 U/L — ABNORMAL HIGH (ref 15–41)
Albumin: 4 g/dL (ref 3.5–5.0)
Alkaline Phosphatase: 156 U/L — ABNORMAL HIGH (ref 38–126)
Anion gap: 10 (ref 5–15)
BUN: 14 mg/dL (ref 6–20)
CO2: 25 mmol/L (ref 22–32)
Calcium: 9 mg/dL (ref 8.9–10.3)
Chloride: 106 mmol/L (ref 98–111)
Creatinine, Ser: 0.53 mg/dL (ref 0.44–1.00)
GFR, Estimated: >60 mL/min (ref >60)
Glucose, Bld: 82 mg/dL (ref 70–99)
Potassium: 3.6 mmol/L (ref 3.5–5.1)
Sodium: 141 mmol/L (ref 135–145)
Total Bilirubin: 5.1 mg/dL — ABNORMAL HIGH (ref 0.3–1.2)
Total Protein: 7.2 g/dL (ref 6.5–8.1)

## 2020-12-29 LAB — PROTIME-INR
INR: 1 (ref 0.8–1.2)
Prothrombin Time: 13.3 seconds (ref 11.4–15.2)

## 2020-12-29 LAB — RESP PANEL BY RT-PCR (FLU A&B, COVID) ARPGX2
Influenza A by PCR: NEGATIVE
Influenza B by PCR: NEGATIVE
SARS Coronavirus 2 by RT PCR: POSITIVE — AB

## 2020-12-29 LAB — CBC
HCT: 38.7 % (ref 36.0–46.0)
Hemoglobin: 13 g/dL (ref 12.0–15.0)
MCH: 29 pg (ref 26.0–34.0)
MCHC: 33.6 g/dL (ref 30.0–36.0)
MCV: 86.4 fL (ref 80.0–100.0)
Platelets: 296 10*3/uL (ref 150–400)
RBC: 4.48 MIL/uL (ref 3.87–5.11)
RDW: 12.3 % (ref 11.5–15.5)
WBC: 8.9 10*3/uL (ref 4.0–10.5)
nRBC: 0 % (ref 0.0–0.2)

## 2020-12-29 LAB — ACETAMINOPHEN LEVEL: Acetaminophen (Tylenol), Serum: <10 ug/mL — ABNORMAL LOW (ref 10–30)

## 2020-12-29 LAB — HEPATITIS B SURFACE ANTIGEN: Hepatitis B Surface Ag: NONREACTIVE

## 2020-12-29 LAB — HIV ANTIBODY (ROUTINE TESTING W REFLEX): HIV Screen 4th Generation wRfx: NONREACTIVE

## 2020-12-29 MED ORDER — GADOBUTROL 1 MMOL/ML IV SOLN
8.0000 mL | Freq: Once | INTRAVENOUS | Status: AC | PRN
  Administered 2020-12-29: 8 mL via INTRAVENOUS
  Filled 2020-12-29: qty 8

## 2020-12-29 MED ORDER — MORPHINE SULFATE (PF) 2 MG/ML IV SOLN
2.0000 mg | INTRAVENOUS | Status: DC | PRN
Start: 2020-12-29 — End: 2021-01-01

## 2020-12-29 MED ORDER — ACETAMINOPHEN 650 MG RE SUPP: 650.0000 mg | Freq: Four times a day (QID) | RECTAL | Status: DC | PRN

## 2020-12-29 MED ORDER — ACETAMINOPHEN 325 MG RE SUPP
650.0000 mg | Freq: Three times a day (TID) | RECTAL | Status: DC | PRN
  Filled 2020-12-29: qty 2

## 2020-12-29 MED ORDER — SODIUM CHLORIDE 0.9 % IV SOLN: INTRAVENOUS | Status: DC

## 2020-12-29 MED ORDER — ACETAMINOPHEN 325 MG PO TABS
650.0000 mg | ORAL_TABLET | Freq: Three times a day (TID) | ORAL | Status: DC | PRN
  Administered 2020-12-30: 650 mg via ORAL
  Filled 2020-12-29: qty 2

## 2020-12-29 MED ORDER — ONDANSETRON HCL 4 MG PO TABS: 4.0000 mg | ORAL_TABLET | Freq: Four times a day (QID) | ORAL | Status: DC | PRN

## 2020-12-29 MED ORDER — ACETAMINOPHEN 325 MG PO TABS: 650.0000 mg | ORAL_TABLET | Freq: Four times a day (QID) | ORAL | Status: DC | PRN

## 2020-12-29 MED ORDER — ONDANSETRON HCL 4 MG/2ML IJ SOLN
4.0000 mg | Freq: Four times a day (QID) | INTRAMUSCULAR | Status: DC | PRN
  Administered 2020-12-31: 4 mg via INTRAVENOUS

## 2020-12-29 NOTE — ED Notes (Signed)
Informed RN bed assigned 

## 2020-12-29 NOTE — Discharge Instructions (Addendum)

## 2020-12-29 NOTE — Consult Note (Signed)
[facility] SURGICAL CONSULTATION NOTE (initial) - cpt: 57322   HISTORY OF PRESENT ILLNESS (HPI):  35 y.o. female presented to [facility] ED yesterday for evaluation of abdominal pain. Patient reports a several day history of right upper quadrant and epigastric abdominal pain. This was initially intermittent at the onset; however, it became more severe and constant. She described this as sharp and colicky in nature. Nothing seemed to make this better. She endorses associated nausea with the pain. No fever, chills, cough, CP, SOB, emesis, or bowel changes. She believes her first episode on Wednesday may have been post-prandial but otherwise denied any association with eating. No previous intra-abdominal surgeries. Work up in the ED revealed a leukocytosis to 17.0K (now 8.9), AST/ALT elevated at 280/332 (now 949/927) , alkaline phos elevated at 139 (now 156), and hyperbilirubinemia to 3.2 (now 5.1). She had RUQ [place] concerning for cholelithiasis but no gross evidence of cholecystitis.  She ultimately underwent MRCP given hyperbilirubinemia but this did not show evidence of choledocholithiasis. She was ultimately admitted to the medicine service.   Surgery is consulted by emergency medicine physician Dr. Marjean Donna, MD in this context for evaluation and management of possible cholecystitis in setting of LFT elevation and hyperbilirubinemia.    PAST MEDICAL HISTORY (PMH):  Past Medical History:  Diagnosis Date   Anxiety      PAST SURGICAL HISTORY ([place]):  Past Surgical History:  Procedure Laterality Date   TONSILLECTOMY     TYMPANOSTOMY TUBE PLACEMENT     WISDOM TOOTH EXTRACTION       MEDICATIONS:  Prior to Admission medications   Medication Sig Start Date End Date Taking? Authorizing Provider  citalopram (CELEXA) 10 MG tablet Take 10 mg by mouth daily. 11/28/20  Yes [provider]  norethindrone (MICRONOR) 0.35 MG tablet Take 1 tablet (0.35 mg total) by mouth daily. 10/14/20   Yes Malachy Mood, MD  Prenatal Vit-Fe Fumarate-FA (PRENATAL PO) Take 1 tablet by mouth daily.   Yes [provider]  Probiotic Product (PROBIOTIC PO) Take by mouth daily.   Yes [provider]  docusate sodium (COLACE) 100 MG capsule Take 100 mg by mouth 2 (two) times daily. Patient not taking: Reported on 12/29/2020    [provider]     ALLERGIES:  Allergies  Allergen Reactions   Penicillins     Other reaction(s): Other (See Comments), Unknown Childhood allergy unsure of reaction but can take cephalosporins       SOCIAL HISTORY:  Social History   Socioeconomic History   Marital status: Married    Spouse name: Not on file   Number of children: Not on file   Years of education: Not on file   Highest education level: Not on file  Occupational History   Not on file  Tobacco Use   Smoking status: Never   Smokeless tobacco: Never  Vaping Use   Vaping Use: Never used  Substance and Sexual Activity   Alcohol use: Yes    Comment: social   Drug use: Never   Sexual activity: Yes    Birth control/protection: Pill  Other Topics Concern   Not on file  Social History Narrative   Not on file   Social Determinants of Health   Financial Resource Strain: Not on file  Food Insecurity: Not on file  Transportation Needs: Not on file  Physical Activity: Not on file  Stress: Not on file  Social Connections: Not on file  Intimate Partner Violence: Not on file  FAMILY HISTORY:  Family History  Problem Relation Age of Onset   Kidney cancer Mother    High blood pressure Mother    Cancer Mother        Neuroendocrine cancer   Healthy Father       REVIEW OF SYSTEMS:  Review of Systems  Constitutional:  Negative for chills and fever.  Respiratory:  Negative for cough and shortness of breath.   Cardiovascular:  Negative for chest pain and palpitations.  Gastrointestinal:  Positive for abdominal pain and nausea. Negative for constipation,  diarrhea and vomiting.  Genitourinary:  Negative for dysuria and urgency.  All other systems reviewed and are negative.  VITAL SIGNS:  Temp:  [98.2 F (36.8 C)] 98.2 F (36.8 C) (10/02 1947) Pulse Rate:  [66-74] 66 (10/03 0602) Resp:  [15-19] 15 (10/03 0602) BP: (118-135)/(64-79) 118/71 (10/03 0602) SpO2:  [97 %-100 %] 100 % (10/03 0602) Weight:  [81.6 kg] 81.6 kg (10/02 1957)     Height: 5\' 2"  (157.5 cm) Weight: 81.6 kg BMI (Calculated): 32.91   INTAKE/OUTPUT:  No intake/output data recorded.  PHYSICAL EXAM:  Physical Exam Vitals and nursing note reviewed. Exam conducted with a chaperone present.  Constitutional:      General: She is not in acute distress.    Appearance: She is well-developed. She is obese. She is not ill-appearing.  HENT:     Head: Normocephalic and atraumatic.  Eyes:     General: Scleral icterus present.     Extraocular Movements: Extraocular movements intact.  Cardiovascular:     Rate and Rhythm: Normal rate and regular rhythm.     Heart sounds: Normal heart sounds.  Pulmonary:     Effort: Pulmonary effort is normal. No respiratory distress.     Breath sounds: Normal breath sounds.  Abdominal:     General: Abdomen is flat. There is no distension.     Palpations: Abdomen is soft.     Tenderness: There is no abdominal tenderness. There is no guarding or rebound. Negative signs include Murphy's sign.     Comments: Abdomen is soft, absolutely no appreciable tenderness, non-distended, no rebound/guarding, negative Murphy's Sign   Genitourinary:    Comments: Deferred Skin:    General: Skin is warm and dry.  Neurological:     General: No focal deficit present.     Mental Status: She is alert and oriented to person, place, and time.  Psychiatric:        Mood and Affect: Mood normal.        Behavior: Behavior normal.     Labs:  CBC Latest Ref Rng & Units 12/29/2020 12/28/2020 08/31/2020  WBC 4.0 - 10.5 K/uL 8.9 17.0(H) 16.2(H)  Hemoglobin 12.0 - 15.0  g/dL 13.0 14.2 11.2(L)  Hematocrit 36.0 - 46.0 % 38.7 43.0 33.3(L)  Platelets 150 - 400 K/uL 296 349 226   CMP Latest Ref Rng & Units 12/29/2020 12/28/2020 08/15/2020  Glucose 70 - 99 mg/dL 82 146(H) 94  BUN 6 - 20 mg/dL 14 17 15   Creatinine 0.44 - 1.00 mg/dL 0.53 0.78 0.57  Sodium 135 - 145 mmol/L 141 142 135  Potassium 3.5 - 5.1 mmol/L 3.6 4.1 3.7  Chloride 98 - 111 mmol/L 106 105 104  CO2 22 - 32 mmol/L 25 27 24   Calcium 8.9 - 10.3 mg/dL 9.0 9.2 8.5(L)  Total Protein 6.5 - 8.1 g/dL 7.2 7.6 6.5  Total Bilirubin 0.3 - 1.2 mg/dL 5.1(H) 3.2(H) 0.8  Alkaline Phos 38 - 126 U/L  156(H) 139(H) 88  AST 15 - 41 U/L 949(H) 280(H) 19  ALT 0 - 44 U/L 927(H) 332(H) 9    Imaging studies:   RUQ [place] (70/04/2020) personally reviewed with cholelithiasis without evidence of cholecystitis, and radiologist report reviewed below:  IMPRESSION: 1. Cholelithiasis and gallbladder wall thickening without additional evidence to suggest the presence of acute cholecystitis. Additional evaluation with a nuclear medicine hepatobiliary scan is recommended if this remains of clinical concern. 2. Hyperechoic liver lesion which may represent a lipoma or hemangioma. Correlation with nonemergent liver protocol CT is recommended.  MRCP (12/29/2020) personally reviewed again showing cholelithiasis and some gallbladder edema, no gross evidence of choledocholithiasis, and radiologist report reviewed below:  IMPRESSION: 1. Study is positive for cholelithiasis. Gallbladder wall also appears thickened and edematous. However, the gallbladder is not distended, and there is no pericholecystic fluid. Additionally, given the absence of a sonographic Murphy's sign on the recent ultrasound examination, findings are not favored to reflect an acute cholecystitis. If there is persistent clinical concern for acute cholecystitis, further evaluation with nuclear medicine hepatic biliary scan could be considered. 2. No  choledocholithiasis or findings to suggest biliary tract obstruction. 3. 2.1 x 1.8 cm lesion in segment 5 of the liver which has unusual imaging characteristics. Overall, this is favored to represent an unusual cavernous hemangioma, however, repeat abdominal MRI with and without IV gadolinium is recommended in 6 months to ensure the stability of this lesion given the slightly atypical imaging findings.  Assessment/Plan: (ICD-10's: K35.20) 35 y.o. female with cholelithiasis without evidence of cholecystitis and worsening transaminitis and hyperbilirubinemia without evidence of choledocholithiasis on MRCP   - Appreciate medicine admission  - ? Consulting GI for evaluation of transaminitis / hyperbilirubinemia   - NPO for now + IVF resuscitation - At some point this admission, she may benefit from cholecystectomy; however, will need to await for improvement in labs +/- additional work up - Monitor abdominal examination - Pain control prn; antiemetics prn - Monitor hyperbilirubinemia; morning labs    - Further management per primary service; we will follow   All of the above findings and recommendations were discussed with the patient, and all of patient's questions were answered to her expressed satisfaction.  Thank you for the opportunity to participate in this patient's care.   -- Edison Simon, PA-C [facility] 12/29/2020, 7:39 AM [phone] M-F: 7am - 4pm

## 2020-12-29 NOTE — ED Notes (Signed)
PATIENT TESTED POSITIVE FOR COVID 2 WEEKS PRIOR TO TODAY.

## 2020-12-29 NOTE — H&P (Signed)
History and Physical    Rhonda Yates MQK:[id number] DOB: 09-17-1985 DOA: 12/29/2020  PCP: Marinda Elk, MD   Patient coming from: home  I have personally briefly reviewed patient's old medical records in [place]  Chief Complaint: abdominal pain  HPI: Rhonda Yates is a 35 y.o. female  17 weeks postpartum with no significant past medical history who presents with several day history of intermittent epigastric and right upper quadrant pain associated with nausea.  The pain has been colicky, radiating to the right and became acutely more severe and constant on the night of arrival.  She had no associated vomiting or diarrhea and denies dysuria.  She has had no cough, chest pain, fever or chills  ED course: On arrival afebrile, BP 135/72, pulse 74 respirations 19 with O2 sat 97% on room air Blood work significant for AST 280, ALT 332 alk phos 139 and total bilirubin 3.2.  WBC 17,000.  Urinalysis unremarkable, lipase normal at 35 COVID-positive  Right upper quadrant ultrasound: Cholelithiasis and gallbladder wall thickening without additional evidence to suggest acute cholecystitis MRCP (unofficial read) no evidence of cholecystitis, no stones in CBD  The ED provider discussed ultrasound and unofficial MRCP findings with surgeon Dr. Dahlia Byes who recommended admission to hospitalist service with ERCP versus cholecystectomy of downtrending.  Patient is at the present time symptom-free.    Review of Systems: As per HPI otherwise all other systems on review of systems negative.    Past Medical History:  Diagnosis Date   Anxiety     Past Surgical History:  Procedure Laterality Date   TONSILLECTOMY     TYMPANOSTOMY TUBE PLACEMENT     WISDOM TOOTH EXTRACTION       reports that she has never smoked. She has never used smokeless tobacco. She reports current alcohol use. She reports that she does not use drugs.  Allergies  Allergen Reactions   Penicillins      Other reaction(s): Other (See Comments), Unknown Childhood allergy unsure of reaction but can take cephalosporins      Family History  Problem Relation Age of Onset   Kidney cancer Mother    High blood pressure Mother    Cancer Mother        Neuroendocrine cancer   Healthy Father       Prior to Admission medications   Medication Sig Start Date End Date Taking? Authorizing Provider  citalopram (CELEXA) 10 MG tablet Take 10 mg by mouth daily. 11/28/20  Yes [provider]  norethindrone (MICRONOR) 0.35 MG tablet Take 1 tablet (0.35 mg total) by mouth daily. 10/14/20  Yes Malachy Mood, MD  Prenatal Vit-Fe Fumarate-FA (PRENATAL PO) Take 1 tablet by mouth daily.   Yes [provider]  Probiotic Product (PROBIOTIC PO) Take by mouth daily.   Yes [provider]  docusate sodium (COLACE) 100 MG capsule Take 100 mg by mouth 2 (two) times daily. Patient not taking: Reported on 12/29/2020    [provider]    Physical Exam: Vitals:   12/28/20 1957 12/28/20 2126 12/29/20 0120 12/29/20 0249  BP:  125/64 124/79 123/69  Pulse:  68 66 67  Resp:  _0 Temp:      TempSrc:      SpO2:  99% 100% 100%  Weight: 81.6 kg     Height: _1  (1.575 m)        Vitals:   12/28/20 1957 12/28/20 2126 12/29/20 0120 12/29/20 0249  BP:  125/64 124/79 123/69  Pulse:  68 66 67  Resp:  _0 Temp:      TempSrc:      SpO2:  99% 100% 100%  Weight: 81.6 kg     Height: _1  (1.575 m)         Constitutional: Alert and oriented x 3 . Not in any apparent distress HEENT:      Head: Normocephalic and atraumatic.         Eyes: PERLA, EOMI, Conjunctivae are normal. Sclera is non-icteric.       Mouth/Throat: Mucous membranes are moist.       Neck: Supple with no signs of meningismus. Cardiovascular: Regular rate and rhythm. No murmurs, gallops, or rubs. 2+ symmetrical distal pulses are present . No JVD. No LE edema Respiratory: Respiratory effort normal .Lungs  sounds clear bilaterally. No wheezes, crackles, or rhonchi.  Gastrointestinal: Soft, non tender, and non distended with positive bowel sounds.  Genitourinary: No CVA tenderness. Musculoskeletal: Nontender with normal range of motion in all extremities. No cyanosis, or erythema of extremities. Neurologic:  Face is symmetric. Moving all extremities. No gross focal neurologic deficits . Skin: Skin is warm, dry.  No rash or ulcers Psychiatric: Mood and affect are normal    Labs on Admission: I have personally reviewed following labs and imaging studies  CBC: Recent Labs  Lab 12/28/20 1955  WBC 17.0*  HGB 14.2  HCT 43.0  MCV 87.2  PLT 366   Basic Metabolic Panel: Recent Labs  Lab 12/28/20 1955  NA 142  K 4.1  CL 105  CO2 27  GLUCOSE 146*  BUN 17  CREATININE 0.78  CALCIUM 9.2   GFR: Estimated Creatinine Clearance: 99.9 mL/min (by C-G formula based on SCr of 0.78 mg/dL). Liver Function Tests: Recent Labs  Lab 12/28/20 1955  AST 280*  ALT 332*  ALKPHOS 139*  BILITOT 3.2*  PROT 7.6  ALBUMIN 4.7   Recent Labs  Lab 12/28/20 1955  LIPASE 35   No results for input(s): AMMONIA in the last 168 hours. Coagulation Profile: No results for input(s): INR, PROTIME in the last 168 hours. Cardiac Enzymes: No results for input(s): CKTOTAL, CKMB, CKMBINDEX, TROPONINI in the last 168 hours. BNP (last 3 results) No results for input(s): PROBNP in the last 8760 hours. HbA1C: No results for input(s): HGBA1C in the last 72 hours. CBG: No results for input(s): GLUCAP in the last 168 hours. Lipid Profile: No results for input(s): CHOL, HDL, LDLCALC, TRIG, CHOLHDL, LDLDIRECT in the last 72 hours. Thyroid Function Tests: No results for input(s): TSH, T4TOTAL, FREET4, T3FREE, THYROIDAB in the last 72 hours. Anemia Panel: No results for input(s): VITAMINB12, FOLATE, FERRITIN, TIBC, IRON, RETICCTPCT in the last 72 hours. Urine analysis:    Component Value Date/Time   COLORURINE  AMBER (A) 12/28/2020 1955   APPEARANCEUR CLEAR (A) 12/28/2020 1955   LABSPEC 1.028 12/28/2020 1955   PHURINE 5.0 12/28/2020 1955   GLUCOSEU NEGATIVE 12/28/2020 1955   HGBUR NEGATIVE 12/28/2020 1955   BILIRUBINUR NEGATIVE 12/28/2020 [place] NEGATIVE 12/28/2020 1955   PROTEINUR 100 (A) 12/28/2020 1955   NITRITE NEGATIVE 12/28/2020 1955   LEUKOCYTESUR TRACE (A) 12/28/2020 1955    Radiological Exams on Admission: US Abdomen Limited RUQ (LIVER/GB)  Result Date: 12/28/2020 CLINICAL DATA:  Right upper quadrant pain x5 days. EXAM: ULTRASOUND ABDOMEN LIMITED RIGHT UPPER QUADRANT COMPARISON:  None. FINDINGS: Gallbladder: Multiple mobile echogenic gallstones are seen within the gallbladder lumen. The gallbladder wall  measures 4.2 mm in thickness. No sonographic Murphy sign noted by sonographer. Common bile duct: Diameter: 3.7 mm Liver: A 1.8 cm x 2.1 cm x 1.7 cm hyperechoic focus is seen within the right lobe of the liver. Within normal limits in parenchymal echogenicity. Portal vein is patent on color Doppler imaging with normal direction of blood flow towards the liver. Other: None. IMPRESSION: 1. Cholelithiasis and gallbladder wall thickening without additional evidence to suggest the presence of acute cholecystitis. Additional evaluation with a nuclear medicine hepatobiliary scan is recommended if this remains of clinical concern. 2. Hyperechoic liver lesion which may represent a lipoma or hemangioma. Correlation with nonemergent liver protocol CT is recommended. Electronically Signed   By: Virgina Norfolk M.D.   On: 12/28/2020 22:39     Assessment/Plan 17 weeks postpartum with no significant past medical history presenting with several day history of intermittent epigastric and right upper quadrant pain      Biliary colic   Elevated LFTs -AST 280, ALT 332 alk phos 139 and total bilirubin 3.2.  WBC 17,000. - RUQ ultrasound: Cholelithiasis and gallbladder wall thickening without  additional evidence to suggest acute cholecystitis -MRCP (unofficial read) no evidence of cholecystitis, no stones in CBD - Continue to trend LFTs - Surgical consult for possible cholecystectomy - We will keep n.p.o.  COVID-positive - Patient was symptomatic 2 weeks prior     DVT prophylaxis: SCDs Code Status: full code  Family Communication: Husband at bedside Disposition Plan: Back to previous home environment Consults called: Surgery Status: Observation    Athena Masse MD [facility]     12/29/2020, 4:12 AM

## 2020-12-29 NOTE — ED Notes (Signed)
PT TAKEN TO MRI AT THIS TIME

## 2020-12-29 NOTE — Progress Notes (Signed)
Patient tested positive at home for COVID 2 weeks ago, then tested positive again in the ED. So we messaged infectious disease prevention to see if the patient  needed to truly be on precautions and she's totally asymptomatic. Per Daralene Milch no she doesn't need precautions if it was two weeks ago and she is asymptomatic now.

## 2020-12-29 NOTE — Consult Note (Addendum)
Rhonda Yates , MD [address] [place], [place], [place], [id number] [place], [place], [place], [address] Phone: [phone]  Fax: [phone]  Consultation  Referring Provider:     Dr Kurtis Bushman Primary Care Physician:  Marinda Elk, MD Primary Gastroenterologist: none          Reason for Consultation:     abnormal lfts  Date of Admission:  12/29/2020 Date of Consultation:  12/29/2020         HPI:   Rhonda Yates is a 35 y.o. female who is 17 weeks postpartum presents with several day history of epigastric and right upper quadrant pain.  Found to have elevated LFTs.  She was positive for COVID-19 2 weeks back.  On admission lipase 35, AST 280 ALT 132 and total bilirubin 3.2 alkaline phosphatase 139, hemoglobin 14.2 g with white cell count of 17.  This morning transaminases are higher up at AST 949 ALT 927 alkaline phosphatase 156 and total bilirubin 5.1.  White cell count has normalized.  Right upper quadrant ultrasound performed on the day of admission showed cholelithiasis and gallbladder wall thickening without additional evidence to suggest acute cholecystitis..  This was followed up by an MRCP that showed cholelithiasis with edematous and thickened gallbladder wall.  No choledocholithiasis.  Presence of cavernous hemangioma.   She states that she developed the first episode of abdominal pain in the epigastrium radiating to the right upper quadrant cramping in nature on Wednesday then it resolved then the pain returned back yesterday which brought her back to the hospital at this time and symptoms are more severe.  Denies any nausea vomiting although she did have a feeling that she should throw up.  Denies any fevers.  No similar episodes in the past.  No family history of gallbladder problems in her mother or sister but her father did have issues with her gallbladder although it is unclear what type.  She denies any over-the-counter medications, herbal  supplements, excess alcohol.  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  No Armed forces logistics/support/administrative officer.  No prior blood transfusions.  She states that the pain reached a peak and all of a sudden resolved.  No pain presently.  Past Medical History:  Diagnosis Date   Anxiety     Past Surgical History:  Procedure Laterality Date   TONSILLECTOMY     TYMPANOSTOMY TUBE PLACEMENT     WISDOM TOOTH EXTRACTION      Prior to Admission medications   Medication Sig Start Date End Date Taking? Authorizing Provider  citalopram (CELEXA) 10 MG tablet Take 10 mg by mouth daily. 11/28/20  Yes [provider]  norethindrone (MICRONOR) 0.35 MG tablet Take 1 tablet (0.35 mg total) by mouth daily. 10/14/20  Yes Malachy Mood, MD  Prenatal Vit-Fe Fumarate-FA (PRENATAL PO) Take 1 tablet by mouth daily.   Yes [provider]  Probiotic Product (PROBIOTIC PO) Take by mouth daily.   Yes [provider]  docusate sodium (COLACE) 100 MG capsule Take 100 mg by mouth 2 (two) times daily. Patient not taking: Reported on 12/29/2020    [provider]    Family History  Problem Relation Age of Onset   Kidney cancer Mother    High blood pressure Mother    Cancer Mother        Neuroendocrine cancer   Healthy Father      Social History   Tobacco Use   Smoking status: Never  Smokeless tobacco: Never  Vaping Use   Vaping Use: Never used  Substance Use Topics   Alcohol use: Yes    Comment: social   Drug use: Never    Allergies as of 12/28/2020 - Review Complete 12/28/2020  Allergen Reaction Noted   Penicillins  11/28/2013    Review of Systems:    All systems reviewed and negative except where noted in HPI.   Physical Exam:  Vital signs in last 24 hours: Temp:  [98.2 F (36.8 C)] 98.2 F (36.8 C) (10/02 1947) Pulse Rate:  [66-74] 66 (10/03 0602) Resp:  [15-19] 15 (10/03 0602) BP: (118-135)/(64-79) 118/71 (10/03 0602) SpO2:  [97 %-100 %] 100 % (10/03  0602) Weight:  [81.6 kg] 81.6 kg (10/02 1957)   General:   Pleasant, cooperative in NAD Head:  Normocephalic and atraumatic. Eyes:   No icterus.   Conjunctiva pink. PERRLA. Ears:  Normal auditory acuity. Neck:  Supple; no masses or thyroidomegaly Lungs: Respirations even and unlabored. Lungs clear to auscultation bilaterally.   No wheezes, crackles, or rhonchi.  Heart:  Regular rate and rhythm;  Without murmur, clicks, rubs or gallops Abdomen:  Soft, nondistended, nontender. Normal bowel sounds. No appreciable masses or hepatomegaly.  No rebound or guarding.  Neurologic:  Alert and oriented x3;  grossly normal neurologically. Skin:  Intact without significant lesions or rashes. Cervical Nodes:  No significant cervical adenopathy. Psych:  Alert and cooperative. Normal affect.  LAB RESULTS: Recent Labs    12/28/20 1955 12/29/20 0602  WBC 17.0* 8.9  HGB 14.2 13.0  HCT 43.0 38.7  PLT 349 296   BMET Recent Labs    12/28/20 1955 12/29/20 0602  NA 142 141  K 4.1 3.6  CL 105 106  CO2 27 25  GLUCOSE 146* 82  BUN 17 14  CREATININE 0.78 0.53  CALCIUM 9.2 9.0   LFT Recent Labs    12/29/20 0602  PROT 7.2  ALBUMIN 4.0  AST 949*  ALT 927*  ALKPHOS 156*  BILITOT 5.1*   PT/INR No results for input(s): LABPROT, INR in the last 72 hours.  STUDIES: MR ABDOMEN MRCP W WO CONTAST  Result Date: 12/29/2020 CLINICAL DATA:  35 year old female with history of elevated liver function tests. Gallstones. Intermittent epigastric pain since Wednesday. EXAM: MRI ABDOMEN WITHOUT AND WITH CONTRAST (INCLUDING MRCP) TECHNIQUE: Multiplanar multisequence MR imaging of the abdomen was performed both before and after the administration of intravenous contrast. Heavily T2-weighted images of the biliary and pancreatic ducts were obtained, and three-dimensional MRCP images were rendered by post processing. CONTRAST:  3mL GADAVIST GADOBUTROL 1 MMOL/ML IV SOLN COMPARISON:  No prior abdominal MRI. Abdominal  ultrasound 12/28/2020. FINDINGS: Lower chest: Unremarkable. Hepatobiliary: In segment 5 of the liver (axial image 40 of series 17) there is a well-defined 2.1 x 1.8 cm lesion which is T1 hypointense, T2 hyperintense, demonstrates early peripheral nodular enhancement with progressive filling (not centripetal). No other suspicious hepatic lesions. No intra or extrahepatic biliary ductal dilatation noted on MRCP images. Common bile duct measures 5 mm in the porta hepatis. No filling defects in the common bile duct to indicate choledocholithiasis. Tiny filling defect in the neck of the gallbladder measuring 3 mm (axial image 18 of series 4). Gallbladder is decompressed. Gallbladder wall does appear thickened and edematous measuring up to 7 mm. No significant pericholecystic fluid. Pancreas: No pancreatic mass. No pancreatic ductal dilatation. No pancreatic or peripancreatic fluid collections or inflammatory changes. Spleen:  Unremarkable. Adrenals/Urinary Tract: Bilateral kidneys and bilateral  adrenal glands are normal in appearance. No hydroureteronephrosis in the visualized portions of the abdomen. Stomach/Bowel: Visualized portions are unremarkable. Vascular/Lymphatic: No aneurysm identified in the visualized abdominal vasculature. No lymphadenopathy noted in the abdomen. Other: No significant volume of ascites noted in the visualized portions of the peritoneal cavity. Musculoskeletal: No aggressive appearing osseous lesions are noted in the visualized portions of the skeleton. IMPRESSION: 1. Study is positive for cholelithiasis. Gallbladder wall also appears thickened and edematous. However, the gallbladder is not distended, and there is no pericholecystic fluid. Additionally, given the absence of a sonographic Murphy's sign on the recent ultrasound examination, findings are not favored to reflect an acute cholecystitis. If there is persistent clinical concern for acute cholecystitis, further evaluation with nuclear  medicine hepatic biliary scan could be considered. 2. No choledocholithiasis or findings to suggest biliary tract obstruction. 3. 2.1 x 1.8 cm lesion in segment 5 of the liver which has unusual imaging characteristics. Overall, this is favored to represent an unusual cavernous hemangioma, however, repeat abdominal MRI with and without IV gadolinium is recommended in 6 months to ensure the stability of this lesion given the slightly atypical imaging findings. Electronically Signed   By: Vinnie Langton M.D.   On: 12/29/2020 06:49   US Abdomen Limited RUQ (LIVER/GB)  Result Date: 12/28/2020 CLINICAL DATA:  Right upper quadrant pain x5 days. EXAM: ULTRASOUND ABDOMEN LIMITED RIGHT UPPER QUADRANT COMPARISON:  None. FINDINGS: Gallbladder: Multiple mobile echogenic gallstones are seen within the gallbladder lumen. The gallbladder wall measures 4.2 mm in thickness. No sonographic Murphy sign noted by sonographer. Common bile duct: Diameter: 3.7 mm Liver: A 1.8 cm x 2.1 cm x 1.7 cm hyperechoic focus is seen within the right lobe of the liver. Within normal limits in parenchymal echogenicity. Portal vein is patent on color Doppler imaging with normal direction of blood flow towards the liver. Other: None. IMPRESSION: 1. Cholelithiasis and gallbladder wall thickening without additional evidence to suggest the presence of acute cholecystitis. Additional evaluation with a nuclear medicine hepatobiliary scan is recommended if this remains of clinical concern. 2. Hyperechoic liver lesion which may represent a lipoma or hemangioma. Correlation with nonemergent liver protocol CT is recommended. Electronically Signed   By: Virgina Norfolk M.D.   On: 12/28/2020 22:39      Impression / Plan:   Rhonda Yates is a 35 y.o. y/o female who is 17 weeks postpartum presents to the hospital with abdominal pain which is biliary in nature and found to have elevated LFTs which is rising but no choledocholithiasis seen on MRCP.   Cholelithiasis is seen along with thickening of the gallbladder wall.  No evidence of acute cholecystitis but may have an element of chronic cholecystitis.  Differentials include passage of a gallstone [most likely], elevated LFTs secondary to COVID-19, acute viral hepatitis.    Plan 1.  Check INR.  If over 1.5 would indicate acute liver failure. 2.  Full viral hepatitis screen.  I will include EBV HSV CMV and VZV. 3.  Monitor CBC with particular importance to platelet count, LFTs, INR on a daily basis till we see a downtrend. 4.  Avoid any hepatotoxic medications and excess Tylenol.  Watch for any mental status changes 5.  Based on the history very likely this is biliary colic and she may have possible gallstone and if no other cause has been found would recommend a cholecystectomy.  Surgery is following. 6.  From a GI point of view she can eat and drink.  I  have discussed her with Dr. Dahlia Byes Thank you for involving me in the care of this patient.      LOS: 0 days   Rhonda Bellows, MD  12/29/2020, 10:52 AM

## 2020-12-29 NOTE — Progress Notes (Signed)
Patient ID: Rhonda Yates, female   DOB: Feb 10, 1986, 34 y.o.   MRN: [id number] This is a no charge note as patient was admitted this AM.  Patient seen and examined H&P reviewed. Rhonda Yates is a 35 y.o. female  17 weeks postpartum with no significant past medical history who presents with several day history of intermittent epigastric and right upper quadrant pain associated with nausea. Right upper quadrant ultrasound: Cholelithiasis and gallbladder wall thickening without additional evidence to suggest acute cholecystitis MRCP (unofficial read) no evidence of cholecystitis, no stones in CBD She is currently NPO.  She denies having abdominal pain nausea vomiting or it comes and goes. She was diagnosed with COVID 2 weeks ago and has no symptoms.  LFT trending up.  Soft benign +bs Trace pedal edema  A/P: GSX input was appreciated, possible CCK NPO IVF Will consult GI as LFT continues to rise/hyperbil.

## 2020-12-29 NOTE — ED Provider Notes (Signed)
[facility] Emergency Department Provider Note  ____________________________________________   Event Date/Time   First MD Initiated Contact with Patient 12/29/20 0004     (approximate)  I have reviewed the triage vital signs and the nursing notes.   HISTORY  Chief Complaint Abdominal Pain    HPI Rhonda Yates is a 35 y.o. female who is 7 weeks postpartum who comes in with concerns for abdominal pain.  Patient reports having some intermittent epigastric abdominal pain since Wednesday.  The pain went away after a few hours however then she started having pain again last night that was constant, severe, epigastric, not better with Tums, PPI, nothing made it worse therefore came into the ER to be evaluated.  Denies any urinary symptoms or history of abdominal surgeries.          Past Medical History:  Diagnosis Date   Anxiety     Patient Active Problem List   Diagnosis Date Noted   Encounter for care or examination of lactating mother 09/01/2020   Elevated blood pressure affecting pregnancy in third trimester, antepartum    Abnormal glucose affecting pregnancy 03/06/2020   Obesity affecting pregnancy in first trimester 03/06/2020    Past Surgical History:  Procedure Laterality Date   TONSILLECTOMY     TYMPANOSTOMY TUBE PLACEMENT     WISDOM TOOTH EXTRACTION      Prior to Admission medications   Medication Sig Start Date End Date Taking? Authorizing Provider  citalopram (CELEXA) 10 MG tablet SMARTSIG:3 Tablet(s) By Mouth Every Other Day PRN 04/23/19   [provider]  docusate sodium (COLACE) 100 MG capsule Take 100 mg by mouth 2 (two) times daily.    [provider]  norethindrone (MICRONOR) 0.35 MG tablet Take 1 tablet (0.35 mg total) by mouth daily. 10/14/20   Malachy Mood, MD  Prenatal Vit-Fe Fumarate-FA (PRENATAL PO) Take 1 tablet by mouth daily.    [provider]  Probiotic Product (PROBIOTIC PO) Take by  mouth daily.    [provider]    Allergies Penicillins  Family History  Problem Relation Age of Onset   Kidney cancer Mother    High blood pressure Mother    Cancer Mother        Neuroendocrine cancer   Healthy Father     Social History Social History   Tobacco Use   Smoking status: Never   Smokeless tobacco: Never  Vaping Use   Vaping Use: Never used  Substance Use Topics   Alcohol use: Yes    Comment: social   Drug use: Never      Review of Systems Constitutional: No fever/chills Eyes: No visual changes. ENT: No sore throat. Cardiovascular: Denies chest pain. Respiratory: Denies shortness of breath. Gastrointestinal abdominal pain, Genitourinary: Negative for dysuria. Musculoskeletal: Negative for back pain. Skin: Negative for rash. Neurological: Negative for headaches, focal weakness or numbness. All other ROS negative ____________________________________________   PHYSICAL EXAM:  VITAL SIGNS: ED Triage Vitals  Enc Vitals Group     BP 12/28/20 1947 135/72     Pulse Rate 12/28/20 1947 74     Resp 12/28/20 1947 19     Temp 12/28/20 1947 98.2 F (36.8 C)     Temp Source 12/28/20 1947 Oral     SpO2 12/28/20 1947 97 %     Weight 12/28/20 1957 180 lb (81.6 kg)     Height 12/28/20 1957 5\' 2"  (1.575 m)     Head Circumference --  Peak Flow --      Pain Score --      Pain Loc --      Pain Edu? --      Excl. in [place]? --     Constitutional: Alert and oriented. Well appearing and in no acute distress. Eyes: Conjunctivae are normal. EOMI. Head: Atraumatic. Nose: No congestion/rhinnorhea. Mouth/Throat: Mucous membranes are moist.   Neck: No stridor. Trachea Midline. FROM Cardiovascular: Normal rate, regular rhythm. Grossly normal heart sounds.  Good peripheral circulation. Respiratory: Normal respiratory effort.  No retractions. Lungs CTAB. Gastrointestinal: Soft and nontender. No distention. No abdominal bruits.  Musculoskeletal: No lower  extremity tenderness nor edema.  No joint effusions. Neurologic:  Normal speech and language. No gross focal neurologic deficits are appreciated.  Skin:  Skin is warm, dry and intact. No rash noted. Psychiatric: Mood and affect are normal. Speech and behavior are normal. GU: Deferred   ____________________________________________   LABS (all labs ordered are listed, but only abnormal results are displayed)  Labs Reviewed  COMPREHENSIVE METABOLIC PANEL - Abnormal; Notable for the following components:      Result Value   Glucose, Bld 146 (*)    AST 280 (*)    ALT 332 (*)    Alkaline Phosphatase 139 (*)    Total Bilirubin 3.2 (*)    All other components within normal limits  CBC - Abnormal; Notable for the following components:   WBC 17.0 (*)    All other components within normal limits  URINALYSIS, COMPLETE (UACMP) WITH MICROSCOPIC - Abnormal; Notable for the following components:   Color, Urine AMBER (*)    APPearance CLEAR (*)    Protein, ur 100 (*)    Leukocytes,Ua TRACE (*)    All other components within normal limits  LIPASE, BLOOD  POC URINE PREG, ED   ____________________________________________   ED ECG REPORT I, Vanessa Verona, the attending physician, personally viewed and interpreted this ECG.  Normal sinus rate of 75, no ST elevation, no T wave inversions, normal intervals ____________________________________________  RADIOLOGY   Official radiology report(s): US Abdomen Limited RUQ (LIVER/GB)  Result Date: 12/28/2020 CLINICAL DATA:  Right upper quadrant pain x5 days. EXAM: ULTRASOUND ABDOMEN LIMITED RIGHT UPPER QUADRANT COMPARISON:  None. FINDINGS: Gallbladder: Multiple mobile echogenic gallstones are seen within the gallbladder lumen. The gallbladder wall measures 4.2 mm in thickness. No sonographic Murphy sign noted by sonographer. Common bile duct: Diameter: 3.7 mm Liver: A 1.8 cm x 2.1 cm x 1.7 cm hyperechoic focus is seen within the right lobe of the  liver. Within normal limits in parenchymal echogenicity. Portal vein is patent on color Doppler imaging with normal direction of blood flow towards the liver. Other: None. IMPRESSION: 1. Cholelithiasis and gallbladder wall thickening without additional evidence to suggest the presence of acute cholecystitis. Additional evaluation with a nuclear medicine hepatobiliary scan is recommended if this remains of clinical concern. 2. Hyperechoic liver lesion which may represent a lipoma or hemangioma. Correlation with nonemergent liver protocol CT is recommended. Electronically Signed   By: Virgina Norfolk M.D.   On: 12/28/2020 22:39    ____________________________________________   PROCEDURES  Procedure(s) performed (including Critical Care):  Procedures   ____________________________________________   INITIAL IMPRESSION / ASSESSMENT AND PLAN / ED COURSE  Rhonda Yates was evaluated in Emergency Department on 12/29/2020 for the symptoms described in the history of present illness. She was evaluated in the context of the global COVID-19 pandemic, which necessitated consideration that the patient might be  at risk for infection with the SARS-CoV-2 virus that causes COVID-19. Institutional protocols and algorithms that pertain to the evaluation of patients at risk for COVID-19 are in a state of rapid change based on information released by regulatory bodies including the CDC and federal and state organizations. These policies and algorithms were followed during the patient's care in the ED.    Patient comes in with epigastric pain status post pregnancy currently breast-feeding.  Labs ordered to evaluate for choledocholithiasis.  These were elevated concerning for the potential of this and ultrasound was ordered in triage to evaluate for cholecystitis, choledocholithiasis.  Pregnancy test was ordered to make sure there is not a pregnancy.  Labs ordered evaluate for any electrolyte abnormalities, AKI At  this time patient currently denies any pain therefore we will hold off on any pain medication.  Ultrasound concerning for cholelithiasis and gallbladder wall thickening but no other evidence to suggest acute cholecystitis recommended hepatobiliary scan.  However I discussed the case with Dr. Dahlia Byes from surgery who recommends MRCP given elevated LFTs to evaluate for choledocholithiasis versus cholecystitis.  Given patient is nontender at this time and afebrile we will hold off on antibiotics.  Incidental finding also on ultrasound.  Discussed with pt report for follow-up CT outpatient  2:26 AM  Discussed with the radiologist who said there is no evidence of cholecystitis that he could tell but there was no stones in the CBD.   2:45 AM Discussed with dr. Dahlia Byes who recommends admission for evalaution of other cause for elevated LFTs, and i If continuing to trend up may need ERCP versus obtaining cholecystectomy if downtrending.   Discussed with the hospitalist for admission     ____________________________________________   FINAL CLINICAL IMPRESSION(S) / ED DIAGNOSES   Final diagnoses:  RUQ abdominal pain  Elevated LFTs  Gallstones      MEDICATIONS GIVEN DURING THIS VISIT:  Medications  gadobutrol (GADAVIST) 1 MMOL/ML injection 8 mL (8 mLs Intravenous Contrast Given 12/29/20 0111)     ED Discharge Orders     None        Note:  This document was prepared using Dragon voice recognition software and may include unintentional dictation errors.    Vanessa Greenup, MD 12/29/20 [phone]

## 2020-12-30 DIAGNOSIS — Z8616 Personal history of COVID-19: Secondary | ICD-10-CM | POA: Diagnosis not present

## 2020-12-30 DIAGNOSIS — K8050 Calculus of bile duct without cholangitis or cholecystitis without obstruction: Secondary | ICD-10-CM | POA: Diagnosis not present

## 2020-12-30 DIAGNOSIS — R7989 Other specified abnormal findings of blood chemistry: Secondary | ICD-10-CM | POA: Diagnosis not present

## 2020-12-30 LAB — COMPREHENSIVE METABOLIC PANEL
ALT: 759 U/L — ABNORMAL HIGH (ref 0–44)
AST: 356 U/L — ABNORMAL HIGH (ref 15–41)
Albumin: 3.9 g/dL (ref 3.5–5.0)
Alkaline Phosphatase: 188 U/L — ABNORMAL HIGH (ref 38–126)
Anion gap: 7 (ref 5–15)
BUN: 9 mg/dL (ref 6–20)
CO2: 24 mmol/L (ref 22–32)
Calcium: 8.9 mg/dL (ref 8.9–10.3)
Chloride: 108 mmol/L (ref 98–111)
Creatinine, Ser: 0.62 mg/dL (ref 0.44–1.00)
GFR, Estimated: >60 mL/min (ref >60)
Glucose, Bld: 73 mg/dL (ref 70–99)
Potassium: 4.8 mmol/L (ref 3.5–5.1)
Sodium: 139 mmol/L (ref 135–145)
Total Bilirubin: 5.1 mg/dL — ABNORMAL HIGH (ref 0.3–1.2)
Total Protein: 6.5 g/dL (ref 6.5–8.1)

## 2020-12-30 LAB — HEPATITIS B E ANTIBODY: Hep B E Ab: NEGATIVE

## 2020-12-30 LAB — BILIRUBIN, DIRECT: Bilirubin, Direct: 0.3 mg/dL — ABNORMAL HIGH (ref 0.0–0.2)

## 2020-12-30 LAB — HEPATITIS A ANTIBODY, IGM: Hep A IgM: NONREACTIVE

## 2020-12-30 LAB — HEPATITIS B CORE ANTIBODY, IGM: Hep B C IgM: NONREACTIVE

## 2020-12-30 LAB — HCV RNA QUANT: HCV Quantitative: NOT DETECTED IU/mL (ref >50)

## 2020-12-30 LAB — HEPATITIS B SURFACE ANTIBODY, QUANTITATIVE: Hep B S AB Quant (Post): 554.6 m[IU]/mL (ref >9.9)

## 2020-12-30 MED ORDER — CITALOPRAM HYDROBROMIDE 10 MG PO TABS
10.0000 mg | ORAL_TABLET | Freq: Every day | ORAL | Status: DC
  Administered 2020-12-30 – 2021-01-01 (×3): 10 mg via ORAL
  Filled 2020-12-30 (×3): qty 1

## 2020-12-30 NOTE — Progress Notes (Signed)
PROGRESS NOTE    Rhonda Yates  MWU:[id number] DOB: 1985-08-29 DOA: 12/29/2020 PCP: Marinda Elk, MD    Brief Narrative:  Rhonda Yates is a 35 y.o. female  17 weeks postpartum, lactating, with no significant past medical history who presents with several day history of intermittent epigastric and right upper quadrant pain associated with nausea.  The pain has been colicky, radiating to the right and became acutely more severe and constant on the night of arrival.  She had no associated vomiting or diarrhea and denies dysuria.  She has had no cough, chest pain, fever or chills.  Found with elevated LFTs and T bilirubin.  COVID-positive.  She was diagnosed with COVID 2 weeks ago and has no symptoms   Right upper quadrant ultrasound: Cholelithiasis and gallbladder wall thickening without additional evidence to suggest acute cholecystitis MRCP (unofficial read) no evidence of cholecystitis, no stones in CBD.  10/4 Ambulating in the hallway. No abdominal pain this am. No n/v     Consultants:  GI, Gsx  Procedures:   Antimicrobials:     Subjective: No sob, or cp  Objective: Vitals:   12/29/20 2002 12/30/20 0026 12/30/20 0409 12/30/20 0730  BP: 123/74 130/89 119/74 135/88  Pulse: (!) 56 (!) 57 61 63  Resp: 16 16 16 16   Temp: 98.2 F (36.8 C) 98.5 F (36.9 C) 98.3 F (36.8 C) 98.6 F (37 C)  TempSrc: Oral Oral Oral Oral  SpO2: 100% 100% 99% 100%  Weight:      Height:        Intake/Output Summary (Last 24 hours) at 12/30/2020 0812 Last data filed at 12/30/2020 0300 Gross per 24 hour  Intake 2883.61 ml  Output --  Net 2883.61 ml   Filed Weights   12/28/20 1957  Weight: 81.6 kg    Examination:  General exam: Appears calm and comfortable  Respiratory system: Clear to auscultation. Respiratory effort normal. Cardiovascular system: S1 & S2 heard, RRR. No JVD, murmurs, rubs, gallops or clicks Gastrointestinal system: Abdomen is nondistended, soft and  nontender. Normal bowel sounds heard. Central nervous system: Alert and oriented. No focal neurological deficits. Extremities:no edema Psychiatry: Judgement and insight appear normal. Mood & affect appropriate.     Data Reviewed: I have personally reviewed following labs and imaging studies  CBC: Recent Labs  Lab 12/28/20 1955 12/29/20 0602  WBC 17.0* 8.9  HGB 14.2 13.0  HCT 43.0 38.7  MCV 87.2 86.4  PLT 349 725   Basic Metabolic Panel: Recent Labs  Lab 12/28/20 1955 12/29/20 0602  NA 142 141  K 4.1 3.6  CL 105 106  CO2 27 25  GLUCOSE 146* 82  BUN 17 14  CREATININE 0.78 0.53  CALCIUM 9.2 9.0   GFR: Estimated Creatinine Clearance: 99.9 mL/min (by C-G formula based on SCr of 0.53 mg/dL). Liver Function Tests: Recent Labs  Lab 12/28/20 1955 12/29/20 0602  AST 280* 949*  ALT 332* 927*  ALKPHOS 139* 156*  BILITOT 3.2* 5.1*  PROT 7.6 7.2  ALBUMIN 4.7 4.0   Recent Labs  Lab 12/28/20 1955  LIPASE 35   No results for input(s): AMMONIA in the last 168 hours. Coagulation Profile: Recent Labs  Lab 12/29/20 1113  INR 1.0   Cardiac Enzymes: No results for input(s): CKTOTAL, CKMB, CKMBINDEX, TROPONINI in the last 168 hours. BNP (last 3 results) No results for input(s): PROBNP in the last 8760 hours. HbA1C: No results for input(s): HGBA1C in the last 72 hours. CBG: No  results for input(s): GLUCAP in the last 168 hours. Lipid Profile: No results for input(s): CHOL, HDL, LDLCALC, TRIG, CHOLHDL, LDLDIRECT in the last 72 hours. Thyroid Function Tests: No results for input(s): TSH, T4TOTAL, FREET4, T3FREE, THYROIDAB in the last 72 hours. Anemia Panel: No results for input(s): VITAMINB12, FOLATE, FERRITIN, TIBC, IRON, RETICCTPCT in the last 72 hours. Sepsis Labs: No results for input(s): PROCALCITON, LATICACIDVEN in the last 168 hours.  Recent Results (from the past 240 hour(s))  Resp Panel by RT-PCR (Flu A&B, Covid) Nasopharyngeal Swab     Status: Abnormal    Collection Time: 12/29/20 12:26 AM   Specimen: Nasopharyngeal Swab; Nasopharyngeal(NP) swabs in vial transport medium  Result Value Ref Range Status   SARS Coronavirus 2 by RT PCR POSITIVE (A) NEGATIVE Final    Comment: RESULT CALLED TO, READ BACK BY AND VERIFIED WITH: VANESSA ASHLEY AT 0134 12/29/20.PMF (NOTE) SARS-CoV-2 target nucleic acids are DETECTED.  The SARS-CoV-2 RNA is generally detectable in upper respiratory specimens during the acute phase of infection. Positive results are indicative of the presence of the identified virus, but do not rule out bacterial infection or co-infection with other pathogens not detected by the test. Clinical correlation with patient history and other diagnostic information is necessary to determine patient infection status. The expected result is Negative.  Fact Sheet for Patients: EntrepreneurPulse.com.au  Fact Sheet for Healthcare Providers: IncredibleEmployment.be  This test is not yet approved or cleared by the [place] FDA and  has been authorized for detection and/or diagnosis of SARS-CoV-2 by FDA under an Emergency Use Authorization (EUA).  This EUA will remain in effect (meaning this test can b e used) for the duration of  the COVID-19 declaration under Section 564(b)(1) of the Act, 21 U.S.C. section 360bbb-3(b)(1), unless the authorization is terminated or revoked sooner.     Influenza A by PCR NEGATIVE NEGATIVE Final   Influenza B by PCR NEGATIVE NEGATIVE Final    Comment: (NOTE) The Xpert Xpress SARS-CoV-2/FLU/RSV plus assay is intended as an aid in the diagnosis of influenza from Nasopharyngeal swab specimens and should not be used as a sole basis for treatment. Nasal washings and aspirates are unacceptable for Xpert Xpress SARS-CoV-2/FLU/RSV testing.  Fact Sheet for Patients: EntrepreneurPulse.com.au  Fact Sheet for Healthcare  Providers: IncredibleEmployment.be  This test is not yet approved or cleared by the [place] FDA and has been authorized for detection and/or diagnosis of SARS-CoV-2 by FDA under an Emergency Use Authorization (EUA). This EUA will remain in effect (meaning this test can be used) for the duration of the COVID-19 declaration under Section 564(b)(1) of the Act, 21 U.S.C. section 360bbb-3(b)(1), unless the authorization is terminated or revoked.  Performed at [facility], [address] [place], [place] [address]          Radiology Studies: MR ABDOMEN MRCP W WO CONTAST  Result Date: 12/29/2020 CLINICAL DATA:  35 year old female with history of elevated liver function tests. Gallstones. Intermittent epigastric pain since Wednesday. EXAM: MRI ABDOMEN WITHOUT AND WITH CONTRAST (INCLUDING MRCP) TECHNIQUE: Multiplanar multisequence MR imaging of the abdomen was performed both before and after the administration of intravenous contrast. Heavily T2-weighted images of the biliary and pancreatic ducts were obtained, and three-dimensional MRCP images were rendered by post processing. CONTRAST:  73mL GADAVIST GADOBUTROL 1 MMOL/ML IV SOLN COMPARISON:  No prior abdominal MRI. Abdominal ultrasound 12/28/2020. FINDINGS: Lower chest: Unremarkable. Hepatobiliary: In segment 5 of the liver (axial image 40 of series 17) there is a well-defined 2.1  x 1.8 cm lesion which is T1 hypointense, T2 hyperintense, demonstrates early peripheral nodular enhancement with progressive filling (not centripetal). No other suspicious hepatic lesions. No intra or extrahepatic biliary ductal dilatation noted on MRCP images. Common bile duct measures 5 mm in the porta hepatis. No filling defects in the common bile duct to indicate choledocholithiasis. Tiny filling defect in the neck of the gallbladder measuring 3 mm (axial image 18 of series 4). Gallbladder is decompressed. Gallbladder wall does appear  thickened and edematous measuring up to 7 mm. No significant pericholecystic fluid. Pancreas: No pancreatic mass. No pancreatic ductal dilatation. No pancreatic or peripancreatic fluid collections or inflammatory changes. Spleen:  Unremarkable. Adrenals/Urinary Tract: Bilateral kidneys and bilateral adrenal glands are normal in appearance. No hydroureteronephrosis in the visualized portions of the abdomen. Stomach/Bowel: Visualized portions are unremarkable. Vascular/Lymphatic: No aneurysm identified in the visualized abdominal vasculature. No lymphadenopathy noted in the abdomen. Other: No significant volume of ascites noted in the visualized portions of the peritoneal cavity. Musculoskeletal: No aggressive appearing osseous lesions are noted in the visualized portions of the skeleton. IMPRESSION: 1. Study is positive for cholelithiasis. Gallbladder wall also appears thickened and edematous. However, the gallbladder is not distended, and there is no pericholecystic fluid. Additionally, given the absence of a sonographic Murphy's sign on the recent ultrasound examination, findings are not favored to reflect an acute cholecystitis. If there is persistent clinical concern for acute cholecystitis, further evaluation with nuclear medicine hepatic biliary scan could be considered. 2. No choledocholithiasis or findings to suggest biliary tract obstruction. 3. 2.1 x 1.8 cm lesion in segment 5 of the liver which has unusual imaging characteristics. Overall, this is favored to represent an unusual cavernous hemangioma, however, repeat abdominal MRI with and without IV gadolinium is recommended in 6 months to ensure the stability of this lesion given the slightly atypical imaging findings. Electronically Signed   By: Vinnie Langton M.D.   On: 12/29/2020 06:49   US LIVER DOPPLER  Result Date: 12/29/2020 CLINICAL DATA:  35 year old female with right upper quadrant pain EXAM: DUPLEX ULTRASOUND OF LIVER TECHNIQUE: Color and  duplex Doppler ultrasound was performed to evaluate the hepatic in-flow and out-flow vessels. COMPARISON:  Abdominal ultrasound 12/28/2020, MRI today FINDINGS: Portal Vein Velocities Main:  44 cm/sec Right:  39 cm/sec Left:  15 cm/sec Hepatic Vein Velocities Right:  32 cm/sec Middle:  27 cm/sec Left:  34 cm/sec Hepatic Artery Velocity:  78 cm/sec Splenic Vein Velocity:  34 cm/sec Varices: Absent Ascites: Absent Spleen size nonenlarged with a estimated volume 237 cc Redemonstration of hyperechoic lesion within segment 6, 1.7 cm. This lesion is better characterized on today's MRI and referral to this report is recommended. Cholelithiasis again demonstrated, better characterized on recent ultrasound abdomen IMPRESSION: Unremarkable directed duplex of the hepatic vasculature. Signed, Dulcy Fanny. Dellia Nims, RPVI [facility] Radiology Electronically Signed   By: Corrie Mckusick D.O.   On: 12/29/2020 13:56   US Abdomen Limited RUQ (LIVER/GB)  Result Date: 12/28/2020 CLINICAL DATA:  Right upper quadrant pain x5 days. EXAM: ULTRASOUND ABDOMEN LIMITED RIGHT UPPER QUADRANT COMPARISON:  None. FINDINGS: Gallbladder: Multiple mobile echogenic gallstones are seen within the gallbladder lumen. The gallbladder wall measures 4.2 mm in thickness. No sonographic Murphy sign noted by sonographer. Common bile duct: Diameter: 3.7 mm Liver: A 1.8 cm x 2.1 cm x 1.7 cm hyperechoic focus is seen within the right lobe of the liver. Within normal limits in parenchymal echogenicity. Portal vein is patent on  color Doppler imaging with normal direction of blood flow towards the liver. Other: None. IMPRESSION: 1. Cholelithiasis and gallbladder wall thickening without additional evidence to suggest the presence of acute cholecystitis. Additional evaluation with a nuclear medicine hepatobiliary scan is recommended if this remains of clinical concern. 2. Hyperechoic liver lesion which may represent a  lipoma or hemangioma. Correlation with nonemergent liver protocol CT is recommended. Electronically Signed   By: Virgina Norfolk M.D.   On: 12/28/2020 22:39        Scheduled Meds:  citalopram  10 mg Oral Daily   Continuous Infusions:  sodium chloride 125 mL/hr at 12/30/20 0755    Assessment & Plan:   Active Problems:   Biliary colic   Elevated LFTs   17 weeks postpartum with no significant past medical history presenting with several day history of intermittent epigastric and right upper quadrant pain       Biliary colic Elevated LFTs RUQ ultrasound: Cholelithiasis and gallbladder wall thickening without additional evidence to suggest acute cholecystitis -MRCP (unofficial read) no evidence of cholecystitis, no stones in CBD 10/4- GI/Gsx following Based on [place] /MRCP, per GI most likely passed a gallstone, transaminases coming down, expect rest to come down by tomorrow. Follow-up viral hepatitis screen If LFT continue to improve, plan for robotic assisted laparoscopic CCK tomorrow 10/5 with Dr. Dahlia Byes pending OR availability. NPO at midnight IVF resuscitation Monitor hyperbilirubinemia   COVID-positive - Patient was symptomatic 2 weeks prior   DVT prophylaxis: Ambulating in the hallway, SCD Code Status: Full Family Communication: None at bedside Disposition Plan:  Status is: Inpatient  Remains inpatient appropriate because:Inpatient level of care appropriate due to severity of illness  Dispo: The patient is from: Home              Anticipated d/c is to: Home              Patient currently is not medically stable to d/c.   Difficult to place patient No     Plan for possible CCK in a.m.      LOS: 1 day   Time spent: 35 minutes with more than 50% on [place], MD [facility] Pager [phone]  If 7PM-7AM, please contact night-coverage 12/30/2020, 8:12 AM

## 2020-12-30 NOTE — Progress Notes (Signed)
Jonathon Bellows , MD [address], [place], [place], [place], [address] [address], [place], [place], [place], [address] Phone: [phone]  Fax: [phone]   Rhonda Yates is being followed for abnormal LFT's  Day 1 of follow up   Subjective: No fuirther pain , doing well . No new complaints.    Objective: Vital signs in last 24 hours: Vitals:   12/29/20 2002 12/30/20 0026 12/30/20 0409 12/30/20 0730  BP: 123/74 130/89 119/74 135/88  Pulse: (!) 56 (!) 57 61 63  Resp: _0 Temp: 98.2 F (36.8 C) 98.5 F (36.9 C) 98.3 F (36.8 C) 98.6 F (37 C)  TempSrc: Oral Oral Oral Oral  SpO2: 100% 100% 99% 100%  Weight:      Height:       Weight change:   Intake/Output Summary (Last 24 hours) at 12/30/2020 0933 Last data filed at 12/30/2020 0300 Gross per 24 hour  Intake 2883.61 ml  Output --  Net 2883.61 ml     Exam:  Abdomen: soft, nontender, normal bowel sounds   Lab Results: _1 @ Micro Results: Recent Results (from the past 240 hour(s))  Resp Panel by RT-PCR (Flu A&B, Covid) Nasopharyngeal Swab     Status: Abnormal   Collection Time: 12/29/20 12:26 AM   Specimen: Nasopharyngeal Swab; Nasopharyngeal(NP) swabs in vial transport medium  Result Value Ref Range Status   SARS Coronavirus 2 by RT PCR POSITIVE (A) NEGATIVE Final    Comment: RESULT CALLED TO, READ BACK BY AND VERIFIED WITH: VANESSA ASHLEY AT 0134 12/29/20.PMF (NOTE) SARS-CoV-2 target nucleic acids are DETECTED.  The SARS-CoV-2 RNA is generally detectable in upper respiratory specimens during the acute phase of infection. Positive results are indicative of the presence of the identified virus, but do not rule out bacterial infection or co-infection with other pathogens not detected by the test. Clinical correlation with patient history and other diagnostic information is necessary to determine patient infection status. The expected result is Negative.  Fact Sheet for  Patients: EntrepreneurPulse.com.au  Fact Sheet for Healthcare Providers: IncredibleEmployment.be  This test is not yet approved or cleared by the [place] FDA and  has been authorized for detection and/or diagnosis of SARS-CoV-2 by FDA under an Emergency Use Authorization (EUA).  This EUA will remain in effect (meaning this test can b e used) for the duration of  the COVID-19 declaration under Section 564(b)(1) of the Act, 21 U.S.C. section 360bbb-3(b)(1), unless the authorization is terminated or revoked sooner.     Influenza A by PCR NEGATIVE NEGATIVE Final   Influenza B by PCR NEGATIVE NEGATIVE Final    Comment: (NOTE) The Xpert Xpress SARS-CoV-2/FLU/RSV plus assay is intended as an aid in the diagnosis of influenza from Nasopharyngeal swab specimens and should not be used as a sole basis for treatment. Nasal washings and aspirates are unacceptable for Xpert Xpress SARS-CoV-2/FLU/RSV testing.  Fact Sheet for Patients: EntrepreneurPulse.com.au  Fact Sheet for Healthcare Providers: IncredibleEmployment.be  This test is not yet approved or cleared by the [place] FDA and has been authorized for detection and/or diagnosis of SARS-CoV-2 by FDA under an Emergency Use Authorization (EUA). This EUA will remain in effect (meaning this test can be used) for the duration of the COVID-19 declaration under Section 564(b)(1) of the Act, 21 U.S.C. section 360bbb-3(b)(1), unless the authorization is terminated or revoked.  Performed at [facility], [address], [place], [place] [address]    Studies/Results: MR ABDOMEN MRCP W  WO CONTAST  Result Date: 12/29/2020 CLINICAL DATA:  35 year old female with history of elevated liver function tests. Gallstones. Intermittent epigastric pain since Wednesday. EXAM: MRI ABDOMEN WITHOUT AND WITH CONTRAST (INCLUDING MRCP) TECHNIQUE: Multiplanar  multisequence MR imaging of the abdomen was performed both before and after the administration of intravenous contrast. Heavily T2-weighted images of the biliary and pancreatic ducts were obtained, and three-dimensional MRCP images were rendered by post processing. CONTRAST:  43m GADAVIST GADOBUTROL 1 MMOL/ML IV SOLN COMPARISON:  No prior abdominal MRI. Abdominal ultrasound 12/28/2020. FINDINGS: Lower chest: Unremarkable. Hepatobiliary: In segment 5 of the liver (axial image 40 of series 17) there is a well-defined 2.1 x 1.8 cm lesion which is T1 hypointense, T2 hyperintense, demonstrates early peripheral nodular enhancement with progressive filling (not centripetal). No other suspicious hepatic lesions. No intra or extrahepatic biliary ductal dilatation noted on MRCP images. Common bile duct measures 5 mm in the porta hepatis. No filling defects in the common bile duct to indicate choledocholithiasis. Tiny filling defect in the neck of the gallbladder measuring 3 mm (axial image 18 of series 4). Gallbladder is decompressed. Gallbladder wall does appear thickened and edematous measuring up to 7 mm. No significant pericholecystic fluid. Pancreas: No pancreatic mass. No pancreatic ductal dilatation. No pancreatic or peripancreatic fluid collections or inflammatory changes. Spleen:  Unremarkable. Adrenals/Urinary Tract: Bilateral kidneys and bilateral adrenal glands are normal in appearance. No hydroureteronephrosis in the visualized portions of the abdomen. Stomach/Bowel: Visualized portions are unremarkable. Vascular/Lymphatic: No aneurysm identified in the visualized abdominal vasculature. No lymphadenopathy noted in the abdomen. Other: No significant volume of ascites noted in the visualized portions of the peritoneal cavity. Musculoskeletal: No aggressive appearing osseous lesions are noted in the visualized portions of the skeleton. IMPRESSION: 1. Study is positive for cholelithiasis. Gallbladder wall also  appears thickened and edematous. However, the gallbladder is not distended, and there is no pericholecystic fluid. Additionally, given the absence of a sonographic Murphy's sign on the recent ultrasound examination, findings are not favored to reflect an acute cholecystitis. If there is persistent clinical concern for acute cholecystitis, further evaluation with nuclear medicine hepatic biliary scan could be considered. 2. No choledocholithiasis or findings to suggest biliary tract obstruction. 3. 2.1 x 1.8 cm lesion in segment 5 of the liver which has unusual imaging characteristics. Overall, this is favored to represent an unusual cavernous hemangioma, however, repeat abdominal MRI with and without IV gadolinium is recommended in 6 months to ensure the stability of this lesion given the slightly atypical imaging findings. Electronically Signed   By: DVinnie LangtonM.D.   On: 12/29/2020 06:49   [place]IVER DOPPLER  Result Date: 12/29/2020 CLINICAL DATA:  35year old female with right upper quadrant pain EXAM: DUPLEX ULTRASOUND OF LIVER TECHNIQUE: Color and duplex Doppler ultrasound was performed to evaluate the hepatic in-flow and out-flow vessels. COMPARISON:  Abdominal ultrasound 12/28/2020, MRI today FINDINGS: Portal Vein Velocities Main:  44 cm/sec Right:  39 cm/sec Left:  15 cm/sec Hepatic Vein Velocities Right:  32 cm/sec Middle:  27 cm/sec Left:  34 cm/sec Hepatic Artery Velocity:  78 cm/sec Splenic Vein Velocity:  34 cm/sec Varices: Absent Ascites: Absent Spleen size nonenlarged with a estimated volume 237 cc Redemonstration of hyperechoic lesion within segment 6, 1.7 cm. This lesion is better characterized on today's MRI and referral to this report is recommended. Cholelithiasis again demonstrated, better characterized on recent ultrasound abdomen IMPRESSION: Unremarkable directed duplex of the hepatic vasculature. Signed, JDulcy Fanny WDellia Nims RWarrenVascular and Interventional Radiology  Specialists  [facility] Radiology Electronically Signed   By: Corrie Mckusick D.O.   On: 12/29/2020 13:56   US Abdomen Limited RUQ (LIVER/GB)  Result Date: 12/28/2020 CLINICAL DATA:  Right upper quadrant pain x5 days. EXAM: ULTRASOUND ABDOMEN LIMITED RIGHT UPPER QUADRANT COMPARISON:  None. FINDINGS: Gallbladder: Multiple mobile echogenic gallstones are seen within the gallbladder lumen. The gallbladder wall measures 4.2 mm in thickness. No sonographic Murphy sign noted by sonographer. Common bile duct: Diameter: 3.7 mm Liver: A 1.8 cm x 2.1 cm x 1.7 cm hyperechoic focus is seen within the right lobe of the liver. Within normal limits in parenchymal echogenicity. Portal vein is patent on color Doppler imaging with normal direction of blood flow towards the liver. Other: None. IMPRESSION: 1. Cholelithiasis and gallbladder wall thickening without additional evidence to suggest the presence of acute cholecystitis. Additional evaluation with a nuclear medicine hepatobiliary scan is recommended if this remains of clinical concern. 2. Hyperechoic liver lesion which may represent a lipoma or hemangioma. Correlation with nonemergent liver protocol CT is recommended. Electronically Signed   By: Virgina Norfolk M.D.   On: 12/28/2020 22:39   Medications: I have reviewed the patient's current medications. Scheduled Meds:  citalopram  10 mg Oral Daily   Continuous Infusions:  sodium chloride 125 mL/hr at 12/30/20 0755   PRN Meds:.acetaminophen **OR** acetaminophen, morphine injection, ondansetron **OR** ondansetron (ZOFRAN) IV  Hepatic Function Latest Ref Rng & Units 12/30/2020 12/29/2020 12/28/2020  Total Protein 6.5 - 8.1 g/dL 6.5 7.2 7.6  Albumin 3.5 - 5.0 g/dL 3.9 4.0 4.7  AST 15 - 41 U/L 356(H) 949(H) 280(H)  ALT 0 - 44 U/L 759(H) 927(H) 332(H)  Alk Phosphatase 38 - 126 U/L 188(H) 156(H) 139(H)  Total Bilirubin 0.3 - 1.2 mg/dL 5.1(H) 5.1(H) 3.2(H)  Bilirubin, Direct 0.0 - 0.2 mg/dL 0.3(H) - -    Assessment: Active  Problems:   Biliary colic   Elevated LFTs  Rhonda Yates is a 34 y.o. y/o female who is 17 weeks postpartum presents to the hospital with abdominal pain which is biliary in nature and found to have elevated LFTs which is rising but no choledocholithiasis seen on MRCP.  Cholelithiasis is seen along with thickening of the gallbladder wall.  No evidence of acute cholecystitis but may have an element of chronic cholecystitis. Most likely passed a gall stone, transaminases coming down , expect rest to come down by tomorrow.  Doppler USG normal   Plan 1.  Follow up viral hepatitis screen. 2. Based on the history very likely this is biliary colic.  Surgery is following.    LOS: 1 day   Jonathon Bellows, MD 12/30/2020, 9:33 AM

## 2020-12-30 NOTE — Progress Notes (Signed)
[facility] SURGICAL PROGRESS NOTE (cpt [phone])  Hospital Day(s): 1.   Interval History: Patient seen and examined, no acute events or new complaints overnight. Patient reports she is feeling well this morning. She denies fever, chills, nausea, emesis, or abdominal pain. Labs are pending this morning. She was seen by GI yesterday; work up is pending. She has been on CLD; tolerating well. Tentatively plan for laparoscopic cholecystectomy tomorrow with Dr Dahlia Byes.   Review of Systems:  Constitutional: denies fever, chills  HEENT: denies cough or congestion  Respiratory: denies any shortness of breath  Cardiovascular: denies chest pain or palpitations  Gastrointestinal: denies abdominal pain, N/V, or diarrhea/and bowel function as per interval history Genitourinary: denies burning with urination or urinary frequency  Vital signs in last 24 hours: [min-max] current  Temp:  [98 F (36.7 C)-98.5 F (36.9 C)] 98.3 F (36.8 C) (10/04 0409) Pulse Rate:  [56-72] 61 (10/04 0409) Resp:  [16-18] 16 (10/04 0409) BP: (117-130)/(68-89) 119/74 (10/04 0409) SpO2:  [98 %-100 %] 99 % (10/04 0409)     Height: 5\' 2"  (157.5 cm) Weight: 81.6 kg BMI (Calculated): 32.91   Intake/Output last 2 shifts:  10/03 0701 - 10/04 0700 In: 2883.6 [P.O.:240; I.V.:2643.6] Out: -    Physical Exam:  Constitutional: alert, cooperative and no distress  HENT: normocephalic without obvious abnormality  Eyes: PERRL, EOM's grossly intact and symmetric  Respiratory: breathing non-labored at rest  Cardiovascular: regular rate and sinus rhythm  Gastrointestinal: Soft, non-tender, and non-distended, no rebound/guarding, negative Murphy's sign  Musculoskeletal: UE and LE FROM, no edema or wounds, motor and sensation grossly intact, NT    Labs:  CBC Latest Ref Rng & Units 12/29/2020 12/28/2020 08/31/2020  WBC 4.0 - 10.5 K/uL 8.9 17.0(H) 16.2(H)  Hemoglobin 12.0 - 15.0 g/dL 13.0 14.2 11.2(L)  Hematocrit 36.0 - 46.0  % 38.7 43.0 33.3(L)  Platelets 150 - 400 K/uL 296 349 226   CMP Latest Ref Rng & Units 12/29/2020 12/28/2020 08/15/2020  Glucose 70 - 99 mg/dL 82 146(H) 94  BUN 6 - 20 mg/dL 14 17 15   Creatinine 0.44 - 1.00 mg/dL 0.53 0.78 0.57  Sodium 135 - 145 mmol/L 141 142 135  Potassium 3.5 - 5.1 mmol/L 3.6 4.1 3.7  Chloride 98 - 111 mmol/L 106 105 104  CO2 22 - 32 mmol/L 25 27 24   Calcium 8.9 - 10.3 mg/dL 9.0 9.2 8.5(L)  Total Protein 6.5 - 8.1 g/dL 7.2 7.6 6.5  Total Bilirubin 0.3 - 1.2 mg/dL 5.1(H) 3.2(H) 0.8  Alkaline Phos 38 - 126 U/L 156(H) 139(H) 88  AST 15 - 41 U/L 949(H) 280(H) 19  ALT 0 - 44 U/L 927(H) 332(H) 9     Imaging studies: No new pertinent imaging studies   Assessment/Plan: (ICD-10's: K8.20) 35 y.o. female with cholelithiasis without evidence of cholecystitis and  transaminitis and hyperbilirubinemia without evidence of choledocholithiasis on MRCP               - Appreciate medicine admission  - Appreciate GI input  - If LFTs are improving, plan for robotic assisted laparoscopic cholecystectomy tomorrow (10/05) with Dr Dahlia Byes pending OR/Anesthesia availability             - Continue CLD; NPO at midnight   - IVF resuscitation - Monitor abdominal examination - Pain control prn; antiemetics prn - Monitor hyperbilirubinemia; morning labs               - Further management per primary service; we will follow  All of the above findings and recommendations were discussed with the patient, and the medical team, and all of patient's questions were answered to her expressed satisfaction.  -- Edison Simon, PA-C [facility] 12/30/2020, 7:22 AM [phone] M-F: 7am - 4pm

## 2020-12-31 ENCOUNTER — Inpatient Hospital Stay: Payer: BC Managed Care – PPO | Admitting: Anesthesiology

## 2020-12-31 ENCOUNTER — Encounter
Admission: EM | Disposition: A | Discharge status: Home / Self Care | Payer: Self-pay | Source: Home / Self Care | Attending: Internal Medicine

## 2020-12-31 DIAGNOSIS — Z789 Other specified health status: Secondary | ICD-10-CM | POA: Diagnosis not present

## 2020-12-31 DIAGNOSIS — K8050 Calculus of bile duct without cholangitis or cholecystitis without obstruction: Secondary | ICD-10-CM | POA: Diagnosis not present

## 2020-12-31 DIAGNOSIS — K8000 Calculus of gallbladder with acute cholecystitis without obstruction: Secondary | ICD-10-CM

## 2020-12-31 DIAGNOSIS — R7989 Other specified abnormal findings of blood chemistry: Secondary | ICD-10-CM | POA: Diagnosis not present

## 2020-12-31 LAB — CMV DNA, QUANTITATIVE, PCR
CMV DNA Quant: NEGATIVE IU/mL
Log10 CMV Qn DNA Pl: UNDETERMINED log10 IU/mL

## 2020-12-31 LAB — COMPREHENSIVE METABOLIC PANEL
ALT: 525 U/L — ABNORMAL HIGH (ref 0–44)
AST: 135 U/L — ABNORMAL HIGH (ref 15–41)
Albumin: 3.6 g/dL (ref 3.5–5.0)
Alkaline Phosphatase: 159 U/L — ABNORMAL HIGH (ref 38–126)
Anion gap: 10 (ref 5–15)
BUN: 7 mg/dL (ref 6–20)
CO2: 23 mmol/L (ref 22–32)
Calcium: 8.6 mg/dL — ABNORMAL LOW (ref 8.9–10.3)
Chloride: 106 mmol/L (ref 98–111)
Creatinine, Ser: 0.67 mg/dL (ref 0.44–1.00)
GFR, Estimated: >60 mL/min (ref >60)
Glucose, Bld: 68 mg/dL — ABNORMAL LOW (ref 70–99)
Potassium: 4 mmol/L (ref 3.5–5.1)
Sodium: 139 mmol/L (ref 135–145)
Total Bilirubin: 3.7 mg/dL — ABNORMAL HIGH (ref 0.3–1.2)
Total Protein: 6.1 g/dL — ABNORMAL LOW (ref 6.5–8.1)

## 2020-12-31 LAB — EPSTEIN BARR VRS(EBV DNA BY PCR): EBV DNA QN by PCR: NEGATIVE IU/mL

## 2020-12-31 SURGERY — CHOLECYSTECTOMY, ROBOT-ASSISTED, LAPAROSCOPIC
Anesthesia: General

## 2020-12-31 MED ORDER — MIDAZOLAM HCL 2 MG/2ML IJ SOLN
INTRAMUSCULAR | Status: DC | PRN
  Administered 2020-12-31: 2 mg via INTRAVENOUS

## 2020-12-31 MED ORDER — FENTANYL CITRATE (PF) 100 MCG/2ML IJ SOLN
INTRAMUSCULAR | Status: AC
  Filled 2020-12-31: qty 2

## 2020-12-31 MED ORDER — PROPOFOL 10 MG/ML IV BOLUS
INTRAVENOUS | Status: DC | PRN
  Administered 2020-12-31: 150 mg via INTRAVENOUS

## 2020-12-31 MED ORDER — ONDANSETRON HCL 4 MG/2ML IJ SOLN
INTRAMUSCULAR | Status: AC
  Filled 2020-12-31: qty 2

## 2020-12-31 MED ORDER — OXYCODONE HCL 5 MG/5ML PO SOLN
5.0000 mg | Freq: Once | ORAL | Status: DC | PRN
Start: 2020-12-31 — End: 2020-12-31

## 2020-12-31 MED ORDER — SODIUM CHLORIDE 0.9 % IV SOLN
INTRAVENOUS | Status: AC
  Filled 2020-12-31: qty 2

## 2020-12-31 MED ORDER — ACETAMINOPHEN 10 MG/ML IV SOLN
INTRAVENOUS | Status: AC
  Filled 2020-12-31: qty 100

## 2020-12-31 MED ORDER — LACTATED RINGERS IV SOLN: INTRAVENOUS | Status: DC

## 2020-12-31 MED ORDER — ACETAMINOPHEN 10 MG/ML IV SOLN
INTRAVENOUS | Status: DC | PRN
  Administered 2020-12-31: 1000 mg via INTRAVENOUS

## 2020-12-31 MED ORDER — FENTANYL CITRATE (PF) 100 MCG/2ML IJ SOLN
INTRAMUSCULAR | Status: DC | PRN
  Administered 2020-12-31: 50 ug via INTRAVENOUS

## 2020-12-31 MED ORDER — KETOROLAC TROMETHAMINE 30 MG/ML IJ SOLN
30.0000 mg | Freq: Four times a day (QID) | INTRAMUSCULAR | Status: DC
  Administered 2020-12-31 – 2021-01-01 (×5): 30 mg via INTRAVENOUS
  Filled 2020-12-31 (×5): qty 1

## 2020-12-31 MED ORDER — SODIUM CHLORIDE 0.9 % IV SOLN
2.0000 g | INTRAVENOUS | Status: AC
  Filled 2020-12-31: qty 2

## 2020-12-31 MED ORDER — OXYCODONE HCL 5 MG PO TABS: 5.0000 mg | ORAL_TABLET | ORAL | Status: DC | PRN

## 2020-12-31 MED ORDER — LACTATED RINGERS IV SOLN: INTRAVENOUS | Status: DC | PRN

## 2020-12-31 MED ORDER — DEXTROSE 10 % IV SOLN: INTRAVENOUS | Status: DC

## 2020-12-31 MED ORDER — FENTANYL CITRATE (PF) 100 MCG/2ML IJ SOLN: 25.0000 ug | INTRAMUSCULAR | Status: DC | PRN

## 2020-12-31 MED ORDER — BUPIVACAINE LIPOSOME 1.3 % IJ SUSP
INTRAMUSCULAR | Status: DC | PRN
  Administered 2020-12-31: 20 mL

## 2020-12-31 MED ORDER — OXYCODONE HCL 5 MG PO TABS: 5.0000 mg | ORAL_TABLET | Freq: Once | ORAL | Status: DC | PRN

## 2020-12-31 MED ORDER — ROCURONIUM BROMIDE 100 MG/10ML IV SOLN
INTRAVENOUS | Status: DC | PRN
  Administered 2020-12-31: 50 mg via INTRAVENOUS

## 2020-12-31 MED ORDER — SUGAMMADEX SODIUM 200 MG/2ML IV SOLN
INTRAVENOUS | Status: DC | PRN
  Administered 2020-12-31: 200 mg via INTRAVENOUS

## 2020-12-31 MED ORDER — LIDOCAINE HCL (CARDIAC) PF 100 MG/5ML IV SOSY
PREFILLED_SYRINGE | INTRAVENOUS | Status: DC | PRN
  Administered 2020-12-31: 50 mg via INTRAVENOUS

## 2020-12-31 MED ORDER — BUPIVACAINE-EPINEPHRINE (PF) 0.25% -1:200000 IJ SOLN
INTRAMUSCULAR | Status: DC | PRN
  Administered 2020-12-31: 30 mL

## 2020-12-31 MED ORDER — BUPIVACAINE-EPINEPHRINE (PF) 0.25% -1:200000 IJ SOLN
INTRAMUSCULAR | Status: AC
  Filled 2020-12-31: qty 30

## 2020-12-31 MED ORDER — MIDAZOLAM HCL 2 MG/2ML IJ SOLN
INTRAMUSCULAR | Status: AC
  Filled 2020-12-31: qty 2

## 2020-12-31 MED ORDER — ONDANSETRON HCL 4 MG/2ML IJ SOLN
4.0000 mg | Freq: Once | INTRAMUSCULAR | Status: AC | PRN
  Administered 2020-12-31: 4 mg via INTRAVENOUS

## 2020-12-31 MED ORDER — BUPIVACAINE LIPOSOME 1.3 % IJ SUSP
INTRAMUSCULAR | Status: AC
  Filled 2020-12-31: qty 20

## 2020-12-31 MED ORDER — DEXAMETHASONE SODIUM PHOSPHATE 10 MG/ML IJ SOLN
INTRAMUSCULAR | Status: DC | PRN
  Administered 2020-12-31: 10 mg via INTRAVENOUS

## 2020-12-31 MED ORDER — INDOCYANINE GREEN 25 MG IV SOLR
2.5000 mg | Freq: Once | INTRAVENOUS | Status: AC
  Administered 2020-12-31: 2.5 mg via INTRAVENOUS
  Filled 2020-12-31: qty 1

## 2020-12-31 MED ORDER — ACETAMINOPHEN 10 MG/ML IV SOLN: 1000.0000 mg | Freq: Once | INTRAVENOUS | Status: DC | PRN

## 2020-12-31 SURGICAL SUPPLY — 50 items
CANNULA REDUC XI 12-8 STAPL (CANNULA) ×1
CANNULA REDUCER 12-8 DVNC XI (CANNULA) ×1 IMPLANT
CHLORAPREP W/TINT 26 (MISCELLANEOUS) ×2 IMPLANT
CLIP LIGATING HEMO O LOK GREEN (MISCELLANEOUS) ×2 IMPLANT
DECANTER SPIKE VIAL GLASS SM (MISCELLANEOUS) ×2 IMPLANT
DEFOGGER SCOPE WARMER CLEARIFY (MISCELLANEOUS) ×2 IMPLANT
DERMABOND ADVANCED (GAUZE/BANDAGES/DRESSINGS) ×1
DERMABOND ADVANCED .7 DNX12 (GAUZE/BANDAGES/DRESSINGS) ×1 IMPLANT
DRAPE ARM DVNC X/XI (DISPOSABLE) ×4 IMPLANT
DRAPE COLUMN DVNC XI (DISPOSABLE) ×1 IMPLANT
DRAPE DA VINCI XI ARM (DISPOSABLE) ×4
DRAPE DA VINCI XI COLUMN (DISPOSABLE) ×1
ELECT CAUTERY BLADE 6.4 (BLADE) ×2 IMPLANT
ELECT REM PT RETURN 9FT ADLT (ELECTROSURGICAL) ×2 IMPLANT
ELECTRODE REM PT RTRN 9FT ADLT (ELECTROSURGICAL) ×1 IMPLANT
GAUZE 4X4 16PLY ~~LOC~~+RFID DBL (SPONGE) IMPLANT
GLOVE SURG ENC MOIS LTX SZ7 (GLOVE) ×12 IMPLANT
GOWN STRL REUS W/ TWL LRG LVL3 (GOWN DISPOSABLE) ×4 IMPLANT
GOWN STRL REUS W/TWL LRG LVL3 (GOWN DISPOSABLE) ×4
IRRIGATION STRYKERFLOW (MISCELLANEOUS) IMPLANT
IRRIGATOR STRYKERFLOW (MISCELLANEOUS) IMPLANT
IV NS 1000ML (IV SOLUTION)
IV NS 1000ML BAXH (IV SOLUTION) IMPLANT
KIT PINK PAD W/HEAD ARE REST (MISCELLANEOUS) ×2 IMPLANT
KIT PINK PAD W/HEAD ARM REST (MISCELLANEOUS) ×1 IMPLANT
LABEL OR SOLS (LABEL) ×2 IMPLANT
MANIFOLD NEPTUNE II (INSTRUMENTS) ×2 IMPLANT
NEEDLE HYPO 22GX1.5 SAFETY (NEEDLE) ×2 IMPLANT
NS IRRIG 500ML POUR BTL (IV SOLUTION) ×2 IMPLANT
OBTURATOR OPTICAL STANDARD 8MM (TROCAR) ×1
OBTURATOR OPTICAL STND 8 DVNC (TROCAR) ×1 IMPLANT
OBTURATOR OPTICALSTD 8 DVNC (TROCAR) ×1 IMPLANT
PACK LAP CHOLECYSTECTOMY (MISCELLANEOUS) ×2 IMPLANT
PENCIL ELECTRO HAND CTR (MISCELLANEOUS) ×2 IMPLANT
POUCH SPECIMEN RETRIEVAL 10MM (ENDOMECHANICALS) ×2 IMPLANT
SEAL CANN UNIV 5-8 DVNC XI (MISCELLANEOUS) ×3 IMPLANT
SEAL XI 5MM-8MM UNIVERSAL (MISCELLANEOUS) ×3
SET TUBE SMOKE EVAC HIGH FLOW (TUBING) ×2 IMPLANT
SOLUTION ELECTROLUBE (MISCELLANEOUS) ×2 IMPLANT
SPONGE T-LAP 18X18 ~~LOC~~+RFID (SPONGE) ×2 IMPLANT
SPONGE T-LAP 4X18 ~~LOC~~+RFID (SPONGE) IMPLANT
STAPLER CANNULA SEAL DVNC XI (STAPLE) ×1 IMPLANT
STAPLER CANNULA SEAL XI (STAPLE) ×1
SUT MNCRL AB 4-0 PS2 18 (SUTURE) ×2 IMPLANT
SUT VICRYL 0 AB UR-6 (SUTURE) ×4 IMPLANT
SYR 20ML LL LF (SYRINGE) ×2 IMPLANT
SYR 30ML LL (SYRINGE) ×2 IMPLANT
TAPE TRANSPORE STRL 2 31045 (GAUZE/BANDAGES/DRESSINGS) ×2 IMPLANT
TROCAR BALLN GELPORT 12X130M (ENDOMECHANICALS) ×2 IMPLANT
WATER STERILE IRR 500ML POUR (IV SOLUTION) ×2 IMPLANT

## 2020-12-31 NOTE — Progress Notes (Signed)
[facility] Hospital Day(s): 2.   Interval History:  Patient seen and examined No acute events or new complaints overnight.  Patient reports she continues to do well No abdominal pain, feer, chills, nausea, emesis  Renal function normal; sCr - 0.97; UO - unmeasured NO electrolyte derangements LFTs and alk phos continue to improve Hyperbilirubinemia down to 3.7 (from 5.1) She is currently NPO  Vital signs in last 24 hours: [min-max] current  Temp:  [97.8 F (36.6 C)-98.7 F (37.1 C)] 97.8 F (36.6 C) (10/05 0549) Pulse Rate:  [55-60] 59 (10/05 0549) Resp:  [16-18] 18 (10/05 0549) BP: (103-131)/(63-80) 106/63 (10/05 0549) SpO2:  [98 %-100 %] 98 % (10/05 0549)     Height: 5' 2"  (157.5 cm) Weight: 81.6 kg BMI (Calculated): 32.91   Intake/Output last 2 shifts:  10/04 0701 - 10/05 0700 In: 240 [P.O.:240] Out: -    Physical Exam:  Constitutional: alert, cooperative and no distress  Respiratory: breathing non-labored at rest  Cardiovascular: regular rate and sinus rhythm  Gastrointestinal: soft, non-tender, and non-distended Integumentary: warm, dry   Labs:  CBC Latest Ref Rng & Units 12/29/2020 12/28/2020 08/31/2020  WBC 4.0 - 10.5 K/uL 8.9 17.0(H) 16.2(H)  Hemoglobin 12.0 - 15.0 g/dL 13.0 14.2 11.2(L)  Hematocrit 36.0 - 46.0 % 38.7 43.0 33.3(L)  Platelets 150 - 400 K/uL 296 349 226   CMP Latest Ref Rng & Units 12/31/2020 12/30/2020 12/29/2020  Glucose 70 - 99 mg/dL 68(L) 73 82  BUN 6 - 20 mg/dL 7 9 14   Creatinine 0.44 - 1.00 mg/dL 0.67 0.62 0.53  Sodium 135 - 145 mmol/L 139 139 141  Potassium 3.5 - 5.1 mmol/L 4.0 4.8 3.6  Chloride 98 - 111 mmol/L 106 108 106  CO2 22 - 32 mmol/L 23 24 25   Calcium 8.9 - 10.3 mg/dL 8.6(L) 8.9 9.0  Total Protein 6.5 - 8.1 g/dL 6.1(L) 6.5 7.2  Total Bilirubin 0.3 - 1.2 mg/dL 3.7(H) 5.1(H) 5.1(H)  Alkaline Phos 38 - 126 U/L 159(H) 188(H) 156(H)  AST 15 - 41 U/L 135(H) 356(H) 949(H)  ALT 0 - 44 U/L 525(H)  759(H) 927(H)     Imaging studies: No new pertinent imaging studies   Assessment/Plan: (ICD-10's: K63.20) 35 y.o. female with cholelithiasis without evidence of cholecystitis and, now improving, transaminitis and hyperbilirubinemia without evidence of choledocholithiasis on MRCP   - NPO for surgery + IVF Resuscitation - IV Abx (Cefotetan - PCN but notes state tolerates cephalosporins); on call to OR for surgical prophylaxis  - Plan for robotic assisted laparoscopic cholecystectomy this afternoon with Dr Dahlia Byes pending OR/Anesthesia availability  - All risks, benefits, and alternatives to above procedure(s) were discussed with the patient, all of her questions were answered to her expressed satisfaction, patient expresses she wishes to proceed, and informed consent was obtained.  - Monitor abdominal examination - Pain control prn; antiemetics prn - Monitor hyperbilirubinemia; improving             - Further management per primary service; we will follow     All of the above findings and recommendations were discussed with the patient, patient's family, and the medical team, and all of patient's and family's questions were answered to their expressed satisfaction.  -- Edison Simon, PA-C [facility] 12/31/2020, 7:49 AM [phone] M-F: 7am - 4pm

## 2020-12-31 NOTE — Op Note (Signed)
Robotic assisted laparoscopic Cholecystectomy  Pre-operative Diagnosis: hx choledocholithiasis and acute cholecystitis  Post-operative Diagnosis: same  Procedure:  Robotic assisted laparoscopic Cholecystectomy  Surgeon: Caroleen Hamman, MD FACS  Anesthesia: Gen. with endotracheal tube  Findings: Acute mild Cholecystitis   Estimated Blood Loss:5 cc       Specimens: Gallbladder           Complications: none   Procedure Details  The patient was seen again in the Holding Room. The benefits, complications, treatment options, and expected outcomes were discussed with the patient. The risks of bleeding, infection, recurrence of symptoms, failure to resolve symptoms, bile duct damage, bile duct leak, retained common bile duct stone, bowel injury, any of which could require further surgery and/or ERCP, stent, or papillotomy were reviewed with the patient. The likelihood of improving the patient's symptoms with return to their baseline status is good.  The patient and/or family concurred with the proposed plan, giving informed consent.  The patient was taken to Operating Room, identified  and the procedure verified as Laparoscopic Cholecystectomy.  A Time Out was held and the above information confirmed.  Prior to the induction of general anesthesia, antibiotic prophylaxis was administered. VTE prophylaxis was in place. General endotracheal anesthesia was then administered and tolerated well. After the induction, the abdomen was prepped with Chloraprep and draped in the sterile fashion. The patient was positioned in the supine position.  Cut down technique was used to enter the abdominal cavity and a Hasson trochar was placed after two vicryl stitches were anchored to the fascia. Pneumoperitoneum was then created with CO2 and tolerated well without any adverse changes in the patient's vital signs.  Three 8-mm ports were placed under direct vision. All skin incisions  were infiltrated with a local  anesthetic agent before making the incision and placing the trocars.   The patient was positioned  in reverse Trendelenburg, robot was brought to the surgical field and docked in the standard fashion.  We made sure all the instrumentation was kept indirect view at all times and that there were no collision between the arms. I scrubbed out and went to the console.  The gallbladder was identified, the fundus grasped and retracted cephalad. Adhesions were lysed bluntly. The infundibulum was grasped and retracted laterally, exposing the peritoneum overlying the triangle of Calot. This was then divided and exposed in a blunt fashion. An extended critical view of the cystic duct and cystic artery was obtained.  The cystic duct was clearly identified and bluntly dissected.   Artery and duct were double clipped and divided. Using ICG cholangiography we visualize the cystic duct and CBD, no abnormal biliary ductal anatomy or evidence of bile injuries were observed. The gallbladder was taken from the gallbladder fossa in a retrograde fashion with the electrocautery.  Hemostasis was achieved with the electrocautery. nspection of the right upper quadrant was performed. No bleeding, bile duct injury or leak, or bowel injury was noted. Robotic instruments and robotic arms were undocked in the standard fashion.  I scrubbed back in.  The gallbladder was removed and placed in an Endocatch bag.   Pneumoperitoneum was released.  The periumbilical port site was closed with interrumpted 0 Vicryl sutures. 4-0 subcuticular Monocryl was used to close the skin. Dermabond was  applied.  The patient was then extubated and brought to the recovery room in stable condition. Sponge, lap, and needle counts were correct at closure and at the conclusion of the case.  Caroleen Hamman, MD, FACS

## 2020-12-31 NOTE — Anesthesia Preprocedure Evaluation (Addendum)
Anesthesia Evaluation  Patient identified by MRN, date of birth, ID band Patient awake    Reviewed: Allergy & Precautions, NPO status , Patient's Chart, lab work & pertinent test results  History of Anesthesia Complications Negative for: history of anesthetic complications  Airway Mallampati: I   Neck ROM: Full    Dental  (+)    Pulmonary  COVID+ 12/29/20, asymptomatic   Pulmonary exam normal breath sounds clear to auscultation       Cardiovascular Exercise Tolerance: Good Normal cardiovascular exam Rhythm:Regular Rate:Normal  ECG 12/28/20: NSR, normal ECG   Neuro/Psych PSYCHIATRIC DISORDERS Anxiety negative neurological ROS     GI/Hepatic Cholelithiasis    Endo/Other  Obesity   Renal/GU negative Renal ROS     Musculoskeletal   Abdominal   Peds  Hematology negative hematology ROS (+)   Anesthesia Other Findings   Reproductive/Obstetrics (+) Breast feeding                             Anesthesia Physical Anesthesia Plan  ASA: 3  Anesthesia Plan: General   Post-op Pain Management:    Induction: Intravenous  PONV Risk Score and Plan: 3 and Ondansetron, Dexamethasone and Treatment may vary due to age or medical condition  Airway Management Planned: Oral ETT  Additional Equipment:   Intra-op Plan:   Post-operative Plan: Extubation in OR  Informed Consent: I have reviewed the patients History and Physical, chart, labs and discussed the procedure including the risks, benefits and alternatives for the proposed anesthesia with the patient or authorized representative who has indicated his/her understanding and acceptance.       Plan Discussed with: CRNA  Anesthesia Plan Comments:        Anesthesia Quick Evaluation

## 2020-12-31 NOTE — Progress Notes (Signed)
GI note  Noted that the LFTs are trending down as expected.  Noted plan for cholecystectomy today.  I will sign off.  Please call me if any further GI concerns or questions.  We would like to thank you for the opportunity to participate in the care of Rhonda Yates.

## 2020-12-31 NOTE — Transfer of Care (Signed)
Immediate Anesthesia Transfer of Care Note  Patient: Rhonda Yates  Procedure(s) Performed: XI ROBOTIC ASSISTED LAPAROSCOPIC CHOLECYSTECTOMY INDOCYANINE GREEN FLUORESCENCE IMAGING (ICG)  Patient Location: PACU  Anesthesia Type:General  Level of Consciousness: drowsy  Airway & Oxygen Therapy: Patient Spontanous Breathing and Patient connected to face mask oxygen  Post-op Assessment: Report given to RN and Post -op Vital signs reviewed and stable  Post vital signs: Reviewed and stable  Last Vitals:  Vitals Value Taken Time  BP 123/67 12/31/20 1357  Temp 36.8 C 12/31/20 1357  Pulse 75 12/31/20 1400  Resp 21 12/31/20 1400  SpO2 100 % 12/31/20 1400  Vitals shown include unvalidated device data.  Last Pain:  Vitals:   12/31/20 1222  TempSrc: Oral  PainSc: 0-No pain         Complications: No notable events documented.

## 2020-12-31 NOTE — Anesthesia Procedure Notes (Signed)
Procedure Name: Intubation Date/Time: 12/31/2020 1:04 PM Performed by: Biagio Borg, CRNA Pre-anesthesia Checklist: Patient identified, Emergency Drugs available, Suction available and Patient being monitored Patient Re-evaluated:Patient Re-evaluated prior to induction Oxygen Delivery Method: Circle system utilized Preoxygenation: Pre-oxygenation with 100% oxygen Induction Type: IV induction Ventilation: Mask ventilation without difficulty Laryngoscope Size: McGraph and 3 Grade View: Grade I Tube type: Oral Tube size: 7.0 mm Number of attempts: 1 Airway Equipment and Method: Stylet Placement Confirmation: ETT inserted through vocal cords under direct vision, positive ETCO2 and breath sounds checked- equal and bilateral Secured at: 21 cm Tube secured with: Tape Dental Injury: Teeth and Oropharynx as per pre-operative assessment

## 2021-01-01 DIAGNOSIS — R7989 Other specified abnormal findings of blood chemistry: Secondary | ICD-10-CM | POA: Diagnosis not present

## 2021-01-01 DIAGNOSIS — K8042 Calculus of bile duct with acute cholecystitis without obstruction: Secondary | ICD-10-CM | POA: Diagnosis not present

## 2021-01-01 DIAGNOSIS — K8050 Calculus of bile duct without cholangitis or cholecystitis without obstruction: Secondary | ICD-10-CM | POA: Diagnosis not present

## 2021-01-01 LAB — HEPATITIS B DNA, ULTRAQUANTITATIVE, PCR
HBV DNA SERPL PCR-ACNC: NOT DETECTED IU/mL
HBV DNA SERPL PCR-LOG IU: UNDETERMINED log10 IU/mL

## 2021-01-01 LAB — HEPATITIS B E ANTIGEN: Hep B E Ag: NEGATIVE

## 2021-01-01 LAB — HSV DNA BY PCR (REFERENCE LAB)
HSV 1 DNA: NEGATIVE
HSV 2 DNA: NEGATIVE

## 2021-01-01 LAB — SURGICAL PATHOLOGY

## 2021-01-01 MED ORDER — ACETAMINOPHEN 325 MG PO TABS: 650.0000 mg | ORAL_TABLET | Freq: Three times a day (TID) | ORAL | 0 refills | Status: DC | PRN

## 2021-01-01 NOTE — Progress Notes (Signed)
POD # 1 Doing well AVSS , + PO Ambulating May dc home Instructions placed on the chart Only restriction is heavy lifting d/w the pt in detail, may shower tomorrow We will follow as outpt

## 2021-01-01 NOTE — Progress Notes (Signed)
PROGRESS NOTE  Rhonda Yates CVE:[id number] DOB: 15-Nov-1985 DOA: 12/29/2020 PCP: Rhonda Elk, MD  Brief History   Rhonda Yates is a 35 y.o. female  17 weeks postpartum, lactating, with no significant past medical history who presents with several day history of intermittent epigastric and right upper quadrant pain associated with nausea.  The pain has been colicky, radiating to the right and became acutely more severe and constant on the night of arrival.  She had no associated vomiting or diarrhea and denies dysuria.  She has had no cough, chest pain, fever or chills.  Found with elevated LFTs and T bilirubin.  COVID-positive.   She was diagnosed with COVID 2 weeks ago and has no symptoms   Right upper quadrant ultrasound: Cholelithiasis and gallbladder wall thickening without additional evidence to suggest acute cholecystitis MRCP (unofficial read) no evidence of cholecystitis, no stones in CBD.   10/4 Ambulating in the hallway. No abdominal pain this am. No n/v. The patient has been evaluated by general surgery.   10/5 Patient is NPO plan is for laparoscopic cholecystectomy later today.  Consultants  General surgery Gastroenterology  Procedures  None  Antibiotics   Anti-infectives (From admission, onward)    Start     Dose/Rate Route Frequency Ordered Stop   12/31/20 1234  sodium chloride 0.9 % with cefoTEtan (CEFOTAN) ADS Med       Note to Pharmacy: Lyman Bishop   : cabinet override      12/31/20 1234 01/01/21 0044   12/31/20 0845  cefoTEtan (CEFOTAN) 2 g in sodium chloride 0.9 % 100 mL IVPB        2 g 200 mL/hr over 30 Minutes Intravenous On call to O.R. 12/31/20 0751 01/01/21 0559      Subjective  The patient is resting comfortably. No new complaints.   Objective   Vitals:  Vitals:   01/01/21 0959 01/01/21 1159  BP: 123/68 112/62  Pulse: 93 71  Resp: 20 18  Temp: 98.7 F (37.1 C) 98.4 F (36.9 C)  SpO2: 99% 99%     Exam:  Constitutional:  Awake, alert, and oriented x 3. Respiratory:  CTA bilaterally, no w/r/r.  Respiratory effort normal. No retractions or accessory muscle use Cardiovascular:  RRR, no m/r/g No LE extremity edema   Normal pedal pulses Abdomen:  Abdomen appears normal; no tenderness or masses No hernias No HSM Musculoskeletal:  Digits/nails BUE: no clubbing, cyanosis, petechiae, infection exam of joints, bones, muscles of at least one of following: head/neck, RUE, LUE, RLE, LLE   Skin:  No rashes, lesions, ulcers palpation of skin: no induration or nodules Neurologic:  CN 2-12 intact Sensation all 4 extremities intact Psychiatric:  Mental status Mood, affect appropriate Orientation to person, place, time  judgment and insight appear intact     I have personally reviewed the following:   [facility]  Imaging  Abdominal ultrasound  Cardiology Data  EKG  Scheduled Meds:  citalopram  10 mg Oral Daily   ketorolac  30 mg Intravenous Q6H   Active Problems:   Biliary colic   Elevated LFTs   LOS: 3 days   A & P  17 weeks postpartum with no significant past medical history presenting with several day history of intermittent epigastric and right upper quadrant pain       Biliary colic Elevated LFTs RUQ ultrasound: Cholelithiasis and gallbladder wall thickening without additional evidence to suggest acute cholecystitis -MRCP (unofficial read)  no evidence of cholecystitis, no stones in CBD 10/4- GI/Gsx following Based on [place] /MRCP, per GI most likely passed a gallstone, transaminases coming down, expect rest to come down by tomorrow. Follow-up viral hepatitis screen Plan is for laparoscopic cholecystectomy later today.   Post partum Breast feeding: Noted. Avoid substances unsafe for breast feeding infants.   COVID-positive - Patient was symptomatic 2 weeks prior     DVT prophylaxis: Ambulating in the hallway, SCD Code  Status: Full Family Communication: None at bedside Disposition Plan:  Status is: Inpatient   Jordynne Mccown, DO [facility] Direct contact: see [email or web address]  7PM-7AM contact night coverage as above 12/31/2020, 4:07 PM  LOS: 3 days

## 2021-01-01 NOTE — Discharge Summary (Addendum)
Physician Discharge Summary  Rhonda Yates TAV:[id number] DOB: 01-May-1985 DOA: 12/29/2020  PCP: Marinda Elk, MD  Admit date: 12/29/2020 Discharge date: 01/01/2021  Recommendations for Outpatient Follow-up:  Discharge to home. Follow up with PCP in 7-10 days. Follow up with general surgery as directed or as needed. Tested COVID +, although asymptomatic and outside of the window for active disease.   Follow-up Information     Tylene Fantasia, PA-C. Go on 01/16/2021.   Specialty: Physician Assistant Why: @ 11:15 Contact information: [address] [place] [address] [phone]                Discharge Diagnoses: Principal diagnosis is #1 Choledocholithiasis with acute cholecystitis Biliary colic Elevated LFT's Breastfeeding Mom   Discharge Condition: Fair  Disposition: Home  Diet recommendation: Avoid fatty foods.  Filed Weights   12/28/20 1957 12/31/20 1222  Weight: 81.6 kg 81.6 kg    History of present illness:   Rhonda Yates is a 35 y.o. female  17 weeks postpartum with no significant past medical history who presents with several day history of intermittent epigastric and right upper quadrant pain associated with nausea.  The pain has been colicky, radiating to the right and became acutely more severe and constant on the night of arrival.  She had no associated vomiting or diarrhea and denies dysuria.  She has had no cough, chest pain, fever or chills   ED course: On arrival afebrile, BP 135/72, pulse 74 respirations 19 with O2 sat 97% on room air Blood work significant for AST 280, ALT 332 alk phos 139 and total bilirubin 3.2.  WBC 17,000.  Urinalysis unremarkable, lipase normal at 35 COVID-positive   Right upper quadrant ultrasound: Cholelithiasis and gallbladder wall thickening without additional evidence to suggest acute cholecystitis MRCP (unofficial read) no evidence of cholecystitis, no stones in CBD   The ED  provider discussed ultrasound and unofficial MRCP findings with surgeon Dr. Dahlia Byes who recommended admission to hospitalist service with ERCP versus cholecystectomy of downtrending.  Patient is at the present time symptom-free.  Hospital Course:  The patient was admitted to a med/surg bed. Gastroenterology was consulted. The patient underwent MRCP that demonstrated cholelithiasis with thickening of the gallbladder wall.Dr. Vicente Males felt that most likely the patient's biliary colic and elevated LFT's was due to the passing of a stone.  General surgery took the patient for laparoscopic cholecystectomy on 12/31/2020. The patient has tolerated the procedure well. She has been cleared for discharge to home by general surgery.  Today's assessment: S: The patient is resting comfortably. She is anxious to go home. No acute distress. O: Vitals:  Vitals:   01/01/21 0959 01/01/21 1159  BP: 123/68 112/62  Pulse: 93 71  Resp: 20 18  Temp: 98.7 F (37.1 C) 98.4 F (36.9 C)  SpO2: 99% 99%    Constitutional:  Patient is awake, alert, and oriented x 3. No acute distress. Respiratory:  CTA bilaterally, no w/r/r.  Respiratory effort normal. No retractions or accessory muscle use Cardiovascular:  RRR, no m/r/g No LE extremity edema   Normal pedal pulses Abdomen:  Abdomen appears normal; no tenderness or masses No hernias No HSM Musculoskeletal:  Digits/nails: no clubbing, cyanosis, petechiae, infection exam of joints, bones, muscles of at least one of following: head/neck, RUE, LUE, RLE, LLE   Skin:  No rashes, lesions, ulcers palpation of skin: no induration or nodules Neurologic:  CN 2-12 intact Sensation all 4 extremities intact Psychiatric:  judgement and insight  appear normal Mental status Mood, affect appropriate Orientation to person, place, time  Discharge Instructions  Discharge Instructions     Activity as tolerated - No restrictions   Complete by: As directed    Call MD for:   persistant nausea and vomiting   Complete by: As directed    Call MD for:  severe uncontrolled pain   Complete by: As directed    Call MD for:  temperature >100.4   Complete by: As directed    Diet - low sodium heart healthy   Complete by: As directed    Avoid fatty foods.   Discharge instructions   Complete by: As directed    Discharge to home. Follow up with PCP in 7-10 days. Follow up with general surgery as directed or as needed. Avoid fatty foods.   Increase activity slowly   Complete by: As directed       Allergies as of 01/01/2021       Reactions   Penicillins    Other reaction(s): Other (See Comments), Unknown Childhood allergy unsure of reaction but can take cephalosporins         Medication List     STOP taking these medications    docusate sodium 100 MG capsule Commonly known as: COLACE       TAKE these medications    acetaminophen 325 MG tablet Commonly known as: TYLENOL Take 2 tablets (650 mg total) by mouth 3 (three) times daily as needed for mild pain (or Fever >/= 101).   citalopram 10 MG tablet Commonly known as: CELEXA Take 10 mg by mouth daily.   norethindrone 0.35 MG tablet Commonly known as: MICRONOR Take 1 tablet (0.35 mg total) by mouth daily.   PRENATAL PO Take 1 tablet by mouth daily.   PROBIOTIC PO Take by mouth daily.       Allergies  Allergen Reactions   Penicillins     Other reaction(s): Other (See Comments), Unknown Childhood allergy unsure of reaction but can take cephalosporins      The results of significant diagnostics from this hospitalization (including imaging, microbiology, ancillary and laboratory) are listed below for reference.    Significant Diagnostic Studies: MR ABDOMEN MRCP W WO CONTAST  Result Date: 12/29/2020 CLINICAL DATA:  35 year old female with history of elevated liver function tests. Gallstones. Intermittent epigastric pain since Wednesday. EXAM: MRI ABDOMEN WITHOUT AND WITH CONTRAST  (INCLUDING MRCP) TECHNIQUE: Multiplanar multisequence MR imaging of the abdomen was performed both before and after the administration of intravenous contrast. Heavily T2-weighted images of the biliary and pancreatic ducts were obtained, and three-dimensional MRCP images were rendered by post processing. CONTRAST:  28m GADAVIST GADOBUTROL 1 MMOL/ML IV SOLN COMPARISON:  No prior abdominal MRI. Abdominal ultrasound 12/28/2020. FINDINGS: Lower chest: Unremarkable. Hepatobiliary: In segment 5 of the liver (axial image 40 of series 17) there is a well-defined 2.1 x 1.8 cm lesion which is T1 hypointense, T2 hyperintense, demonstrates early peripheral nodular enhancement with progressive filling (not centripetal). No other suspicious hepatic lesions. No intra or extrahepatic biliary ductal dilatation noted on MRCP images. Common bile duct measures 5 mm in the porta hepatis. No filling defects in the common bile duct to indicate choledocholithiasis. Tiny filling defect in the neck of the gallbladder measuring 3 mm (axial image 18 of series 4). Gallbladder is decompressed. Gallbladder wall does appear thickened and edematous measuring up to 7 mm. No significant pericholecystic fluid. Pancreas: No pancreatic mass. No pancreatic ductal dilatation. No pancreatic or peripancreatic fluid collections  or inflammatory changes. Spleen:  Unremarkable. Adrenals/Urinary Tract: Bilateral kidneys and bilateral adrenal glands are normal in appearance. No hydroureteronephrosis in the visualized portions of the abdomen. Stomach/Bowel: Visualized portions are unremarkable. Vascular/Lymphatic: No aneurysm identified in the visualized abdominal vasculature. No lymphadenopathy noted in the abdomen. Other: No significant volume of ascites noted in the visualized portions of the peritoneal cavity. Musculoskeletal: No aggressive appearing osseous lesions are noted in the visualized portions of the skeleton. IMPRESSION: 1. Study is positive for  cholelithiasis. Gallbladder wall also appears thickened and edematous. However, the gallbladder is not distended, and there is no pericholecystic fluid. Additionally, given the absence of a sonographic Murphy's sign on the recent ultrasound examination, findings are not favored to reflect an acute cholecystitis. If there is persistent clinical concern for acute cholecystitis, further evaluation with nuclear medicine hepatic biliary scan could be considered. 2. No choledocholithiasis or findings to suggest biliary tract obstruction. 3. 2.1 x 1.8 cm lesion in segment 5 of the liver which has unusual imaging characteristics. Overall, this is favored to represent an unusual cavernous hemangioma, however, repeat abdominal MRI with and without IV gadolinium is recommended in 6 months to ensure the stability of this lesion given the slightly atypical imaging findings. Electronically Signed   By: Vinnie Langton M.D.   On: 12/29/2020 06:49   US LIVER DOPPLER  Result Date: 12/29/2020 CLINICAL DATA:  35 year old female with right upper quadrant pain EXAM: DUPLEX ULTRASOUND OF LIVER TECHNIQUE: Color and duplex Doppler ultrasound was performed to evaluate the hepatic in-flow and out-flow vessels. COMPARISON:  Abdominal ultrasound 12/28/2020, MRI today FINDINGS: Portal Vein Velocities Main:  44 cm/sec Right:  39 cm/sec Left:  15 cm/sec Hepatic Vein Velocities Right:  32 cm/sec Middle:  27 cm/sec Left:  34 cm/sec Hepatic Artery Velocity:  78 cm/sec Splenic Vein Velocity:  34 cm/sec Varices: Absent Ascites: Absent Spleen size nonenlarged with a estimated volume 237 cc Redemonstration of hyperechoic lesion within segment 6, 1.7 cm. This lesion is better characterized on today's MRI and referral to this report is recommended. Cholelithiasis again demonstrated, better characterized on recent ultrasound abdomen IMPRESSION: Unremarkable directed duplex of the hepatic vasculature. Signed, Dulcy Fanny. Dellia Nims, RPVI [facility] Electronically Signed   By: Corrie Mckusick D.O.   On: 12/29/2020 13:56   US Abdomen Limited RUQ (LIVER/GB)  Result Date: 12/28/2020 CLINICAL DATA:  Right upper quadrant pain x5 days. EXAM: ULTRASOUND ABDOMEN LIMITED RIGHT UPPER QUADRANT COMPARISON:  None. FINDINGS: Gallbladder: Multiple mobile echogenic gallstones are seen within the gallbladder lumen. The gallbladder wall measures 4.2 mm in thickness. No sonographic Murphy sign noted by sonographer. Common bile duct: Diameter: 3.7 mm Liver: A 1.8 cm x 2.1 cm x 1.7 cm hyperechoic focus is seen within the right lobe of the liver. Within normal limits in parenchymal echogenicity. Portal vein is patent on color Doppler imaging with normal direction of blood flow towards the liver. Other: None. IMPRESSION: 1. Cholelithiasis and gallbladder wall thickening without additional evidence to suggest the presence of acute cholecystitis. Additional evaluation with a nuclear medicine hepatobiliary scan is recommended if this remains of clinical concern. 2. Hyperechoic liver lesion which may represent a lipoma or hemangioma. Correlation with nonemergent liver protocol CT is recommended. Electronically Signed   By: Virgina Norfolk M.D.   On: 12/28/2020 22:39    Microbiology: Recent Results (from the past 240 hour(s))  Resp Panel by RT-PCR (Flu A&B, Covid) Nasopharyngeal Swab     Status: Abnormal  Collection Time: 12/29/20 12:26 AM   Specimen: Nasopharyngeal Swab; Nasopharyngeal(NP) swabs in vial transport medium  Result Value Ref Range Status   SARS Coronavirus 2 by RT PCR POSITIVE (A) NEGATIVE Final    Comment: RESULT CALLED TO, READ BACK BY AND VERIFIED WITH: VANESSA ASHLEY AT 0134 12/29/20.PMF (NOTE) SARS-CoV-2 target nucleic acids are DETECTED.  The SARS-CoV-2 RNA is generally detectable in upper respiratory specimens during the acute phase of infection. Positive results are indicative of the  presence of the identified virus, but do not rule out bacterial infection or co-infection with other pathogens not detected by the test. Clinical correlation with patient history and other diagnostic information is necessary to determine patient infection status. The expected result is Negative.  Fact Sheet for Patients: EntrepreneurPulse.com.au  Fact Sheet for Healthcare Providers: IncredibleEmployment.be  This test is not yet approved or cleared by the [place] FDA and  has been authorized for detection and/or diagnosis of SARS-CoV-2 by FDA under an Emergency Use Authorization (EUA).  This EUA will remain in effect (meaning this test can b e used) for the duration of  the COVID-19 declaration under Section 564(b)(1) of the Act, 21 U.S.C. section 360bbb-3(b)(1), unless the authorization is terminated or revoked sooner.     Influenza A by PCR NEGATIVE NEGATIVE Final   Influenza B by PCR NEGATIVE NEGATIVE Final    Comment: (NOTE) The Xpert Xpress SARS-CoV-2/FLU/RSV plus assay is intended as an aid in the diagnosis of influenza from Nasopharyngeal swab specimens and should not be used as a sole basis for treatment. Nasal washings and aspirates are unacceptable for Xpert Xpress SARS-CoV-2/FLU/RSV testing.  Fact Sheet for Patients: EntrepreneurPulse.com.au  Fact Sheet for Healthcare Providers: IncredibleEmployment.be  This test is not yet approved or cleared by the [place] FDA and has been authorized for detection and/or diagnosis of SARS-CoV-2 by FDA under an Emergency Use Authorization (EUA). This EUA will remain in effect (meaning this test can be used) for the duration of the COVID-19 declaration under Section 564(b)(1) of the Act, 21 U.S.C. section 360bbb-3(b)(1), unless the authorization is terminated or revoked.  Performed at [facility], [address] [place], [place]  [id number]      Labs: Basic Metabolic Panel: Recent Labs  Lab 12/28/20 1955 12/29/20 0602 12/30/20 0753 12/31/20 0547  NA 142 141 139 139  K 4.1 3.6 4.8 4.0  CL 105 106 108 106  CO2 27 25 24 23   GLUCOSE 146* 82 73 68*  BUN 17 14 9 7   CREATININE 0.78 0.53 0.62 0.67  CALCIUM 9.2 9.0 8.9 8.6*   Liver Function Tests: Recent Labs  Lab 12/28/20 1955 12/29/20 0602 12/30/20 0753 12/31/20 0547  AST 280* 949* 356* 135*  ALT 332* 927* 759* 525*  ALKPHOS 139* 156* 188* 159*  BILITOT 3.2* 5.1* 5.1* 3.7*  PROT 7.6 7.2 6.5 6.1*  ALBUMIN 4.7 4.0 3.9 3.6   Recent Labs  Lab 12/28/20 1955  LIPASE 35   No results for input(s): AMMONIA in the last 168 hours. CBC: Recent Labs  Lab 12/28/20 1955 12/29/20 0602  WBC 17.0* 8.9  HGB 14.2 13.0  HCT 43.0 38.7  MCV 87.2 86.4  PLT 349 296   Cardiac Enzymes: No results for input(s): CKTOTAL, CKMB, CKMBINDEX, TROPONINI in the last 168 hours. BNP: BNP (last 3 results) No results for input(s): BNP in the last 8760 hours.  ProBNP (last 3 results) No results for input(s): PROBNP in the last 8760 hours.  CBG: No results for  input(s): GLUCAP in the last 168 hours.  Active Problems:   Biliary colic   Elevated LFTs   Time coordinating discharge: 38 minutes  Signed:        Eliceo Gladu, DO [facility]  01/01/2021, 4:20 PM

## 2021-01-01 NOTE — Anesthesia Postprocedure Evaluation (Signed)
Anesthesia Post Note  Patient: JENIFER STRUVE  Procedure(s) Performed: XI ROBOTIC ASSISTED LAPAROSCOPIC CHOLECYSTECTOMY INDOCYANINE GREEN FLUORESCENCE IMAGING (ICG)  Patient location during evaluation: PACU Anesthesia Type: General Level of consciousness: awake and alert, oriented and patient cooperative Pain management: pain level controlled Vital Signs Assessment: post-procedure vital signs reviewed and stable Respiratory status: spontaneous breathing, nonlabored ventilation and respiratory function stable Cardiovascular status: blood pressure returned to baseline and stable Postop Assessment: adequate PO intake Anesthetic complications: no   No notable events documented.   Last Vitals:  Vitals:   01/01/21 0518 01/01/21 0959  BP: 118/71 123/68  Pulse: 61 93  Resp: 16 20  Temp: 36.6 C 37.1 C  SpO2: 99% 99%    Last Pain:  Vitals:   01/01/21 0959  TempSrc: Oral  PainSc:                  Darrin Nipper

## 2021-01-02 LAB — HEPATITIS C ANTIBODY: HCV Ab: <0.1 s/co ratio — AB (ref 0.0–0.9)

## 2021-01-03 LAB — VARICELLA-ZOSTER BY PCR: Varicella-Zoster, PCR: NEGATIVE

## 2021-01-16 ENCOUNTER — Other Ambulatory Visit: Payer: Self-pay

## 2021-01-16 ENCOUNTER — Ambulatory Visit (INDEPENDENT_AMBULATORY_CARE_PROVIDER_SITE_OTHER): Payer: BC Managed Care – PPO | Admitting: Physician Assistant

## 2021-01-16 ENCOUNTER — Encounter: Payer: Self-pay | Admitting: Physician Assistant

## 2021-01-16 VITALS — BP 125/74 | HR 64 | Temp 98.9°F | Ht 62.0 in | Wt 179.8 lb

## 2021-01-16 DIAGNOSIS — K8050 Calculus of bile duct without cholangitis or cholecystitis without obstruction: Secondary | ICD-10-CM

## 2021-01-16 DIAGNOSIS — R7989 Other specified abnormal findings of blood chemistry: Secondary | ICD-10-CM

## 2021-01-16 DIAGNOSIS — Z09 Encounter for follow-up examination after completed treatment for conditions other than malignant neoplasm: Secondary | ICD-10-CM

## 2021-01-16 NOTE — Patient Instructions (Signed)

## 2021-01-16 NOTE — Progress Notes (Signed)
[facility] POST-OP OFFICE VISIT  01/16/2021  HPI: Rhonda Yates is a 35 y.o. female 16 days s/p robotic assisted laparoscopic cholecystectomy with Dr Dahlia Byes.   She is doing well No issues with pain, fever, chills, nausea, or emesis She has chronic diarrhea No issues with PO intake She does complain of some sutures sticking out of her laparoscopic incisions, but otherwise no issues  Vital signs: BP 125/74   Pulse 64   Temp 98.9 F (37.2 C) (Oral)   Ht 5\' 2"  (1.575 m)   Wt 179 lb 12.8 oz (81.6 kg)   SpO2 97%   BMI 32.89 kg/m    Physical Exam: Constitutional: Well appearing female, NAD Abdomen: Soft, non-tender, non-distended, no rebound/guarding Skin: Laparoscopic incisions are well healed, no erythema or drainage. There was Monocryl sticking out of a few of her laparoscopic incisions, I removed these.    Assessment/Plan: This is a 35 y.o. female 16 days s/p robotic assisted laparoscopic cholecystectomy    - Pain control prn  - Reviewed wound care  - Reviewed lifting restrictions; 4 weeks total  - Reviewed surgical pathology: CCC  - She can rtc on as needed basis. She understands to call with questions/concerns.   -- Edison Simon, PA-C [facility] 01/16/2021, 11:29 AM [phone] M-F: 7am - 4pm

## 2021-12-21 ENCOUNTER — Telehealth: Payer: Self-pay

## 2021-12-21 NOTE — Telephone Encounter (Signed)
I contacted patient via phone. I left voicemail for patient to call back to be scheduled.   

## 2021-12-21 NOTE — Telephone Encounter (Signed)
Request recv'd from pharm for refill of Norethindrone 0.'35mg'$  tabs; pt needs annual.

## 2021-12-21 NOTE — Telephone Encounter (Signed)
I contacted patient via phone. I left message for patient to call back to be scheduled.

## 2022-03-20 ENCOUNTER — Ambulatory Visit
Admission: EM | Admit: 2022-03-20 | Discharge: 2022-03-20 | Disposition: A | Discharge status: Home / Self Care | Payer: BC Managed Care – PPO | Attending: Urgent Care | Admitting: Urgent Care

## 2022-03-20 DIAGNOSIS — R6889 Other general symptoms and signs: Secondary | ICD-10-CM

## 2022-03-20 MED ORDER — OSELTAMIVIR PHOSPHATE 75 MG PO CAPS: 75.0000 mg | ORAL_CAPSULE | Freq: Two times a day (BID) | ORAL | 0 refills | Status: DC

## 2022-03-20 NOTE — Discharge Instructions (Signed)
You have been diagnosed with a viral upper respiratory infection based on your symptoms and exam. Viral illnesses cannot be treated with antibiotics - they are self limiting - and you should find your symptoms resolving within a few days. Get plenty of rest and non-caffeinated fluids. Watch for signs of dehydration including reduced urine output and dark colored urine.  I have prescribed Tamiflu, antiviral therapy for influenza A, based on a presumptive diagnosis of influenza.   We recommend you use over-the-counter medications for symptom control including Tylenol or ibuprofen for fever, chills or body aches, and cold/cough medication.  Saline mist spray is helpful for removing excess mucus from your nose.  Room humidifiers are helpful to ease breathing at night. I recommend guaifenesin (Mucinex) to help thin and loosen mucus secretions in your respiratory passages.   If appropriate based upon your other medical problems, you might also find relief of nasal/sinus congestion symptoms by using a nasal decongestant such as Flonase (fluticasone) or Sudafed sinus (pseudoephedrine).  You will need to obtain Sudafed from behind the pharmacist counter.  Speak to the pharmacist to verify that you are not duplicating medications with other over-the-counter formulations that you may be using.   Follow up here or with your primary care provider if your symptoms are worsening or not improving.

## 2022-03-20 NOTE — ED Triage Notes (Signed)
Pt. Presents to UC w/ c/o a cough and body aches since yesterday. Pt. Also endorses a fever that started last night but has now resolved.

## 2022-03-20 NOTE — ED Provider Notes (Signed)
Rhonda Yates    CSN: [id number] Arrival date & time: 03/20/22  0907      History   Chief Complaint Chief Complaint  Patient presents with   Fever   Generalized Body Aches   Cough    HPI Rhonda Yates is a 36 y.o. female.    Fever Associated symptoms: cough   Cough Associated symptoms: fever     Presents to urgent care with complaint of cough and bodyaches since yesterday.  Endorses fever starting last night now resolved.  Continues to have chills and bodyaches.  She states her throat feels like shards of glass when she coughs.  Past Medical History:  Diagnosis Date   Anxiety     Patient Active Problem List   Diagnosis Date Noted   SVD (spontaneous vaginal delivery)    Biliary colic 35/32/9924   Elevated LFTs 12/29/2020   Encounter for care or examination of lactating mother 09/01/2020   Elevated blood pressure affecting pregnancy in third trimester, antepartum    Abnormal glucose affecting pregnancy 03/06/2020   Obesity affecting pregnancy in first trimester 03/06/2020    Past Surgical History:  Procedure Laterality Date   TONSILLECTOMY     TYMPANOSTOMY TUBE PLACEMENT     WISDOM TOOTH EXTRACTION      OB History     Gravida  1   Para  1   Term  1   Preterm      AB      Living  1      SAB      IAB      Ectopic      Multiple  0   Live Births  1            Home Medications    Prior to Admission medications   Medication Sig Start Date End Date Taking? Authorizing Provider  acetaminophen (TYLENOL) 500 MG tablet Take by mouth.    [provider]  citalopram (CELEXA) 10 MG tablet Take 10 mg by mouth daily. 11/28/20   [provider]  norethindrone (MICRONOR) 0.35 MG tablet Take 1 tablet (0.35 mg total) by mouth daily. 10/14/20   Malachy Mood, MD  Prenatal Vit-Fe Fumarate-FA (PRENATAL PO) Take 1 tablet by mouth daily.    [provider]  Probiotic Product (PROBIOTIC PO) Take by mouth daily.     [provider]    Family History Family History  Problem Relation Age of Onset   Kidney cancer Mother    High blood pressure Mother    Cancer Mother        Neuroendocrine cancer   Healthy Father     Social History Social History   Tobacco Use   Smoking status: Never   Smokeless tobacco: Never  Vaping Use   Vaping Use: Never used  Substance Use Topics   Alcohol use: Yes    Comment: social   Drug use: Never     Allergies   Penicillins   Review of Systems Review of Systems  Constitutional:  Positive for fever.  Respiratory:  Positive for cough.      Physical Exam Triage Vital Signs ED Triage Vitals [03/20/22 1057]  Enc Vitals Group     BP 129/80     Pulse Rate 88     Resp 17     Temp 98.2 F (36.8 C)     Temp src      SpO2 96 %     Weight  Height      Head Circumference      Peak Flow      Pain Score 0     Pain Loc      Pain Edu?      Excl. in [place]?    No data found.  Updated Vital Signs BP 129/80   Pulse 88   Temp 98.2 F (36.8 C)   Resp 17   LMP 03/08/2022 (Approximate)   SpO2 96%   Breastfeeding Yes   Visual Acuity Right Eye Distance:   Left Eye Distance:   Bilateral Distance:    Right Eye Near:   Left Eye Near:    Bilateral Near:     Physical Exam Vitals reviewed.  Constitutional:      Appearance: Normal appearance. She is ill-appearing.  HENT:     Mouth/Throat:     Pharynx: No oropharyngeal exudate or posterior oropharyngeal erythema.  Cardiovascular:     Rate and Rhythm: Normal rate and regular rhythm.     Pulses: Normal pulses.     Heart sounds: Normal heart sounds.  Pulmonary:     Effort: Pulmonary effort is normal.     Breath sounds: Normal breath sounds.  Skin:    General: Skin is warm and dry.  Neurological:     General: No focal deficit present.     Mental Status: She is alert and oriented to person, place, and time.  Psychiatric:        Mood and Affect: Mood normal.        Behavior: Behavior  normal.      UC Treatments / Results  Labs (all labs ordered are listed, but only abnormal results are displayed) Labs Reviewed - No data to display  EKG   Radiology No results found.  Procedures Procedures (including critical care time)  Medications Ordered in UC Medications - No data to display  Initial Impression / Assessment and Plan / UC Course  I have reviewed the triage vital signs and the nursing notes.  Pertinent labs & imaging results that were available during my care of the patient were reviewed by me and considered in my medical decision making (see chart for details).   Patient is afebrile here without recent antipyretics, though she continues to have chills and bodyaches. Satting well on room air. Overall is ill appearing, well hydrated, without respiratory distress. Pulmonary exam is unremarkable.  Lungs CTAB without wheezing, rhonchi, rales.  No pharyngeal erythema or peritonsillar exudates.  Symptoms are consistent with viral URI including influenza which we will treat for presumptively in the setting of limited lab resources.  Continuing to recommend use of OTC medication for symptom control including ibuprofen/acetaminophen for fever/chills/body aches/sore throat.  Also recommended use of "Chloraseptic" throat spray for relief of sore throat.  Mucinex and cough suppressant also recommended as was Sudafed for sinus congestion.  Final Clinical Impressions(s) / UC Diagnoses   Final diagnoses:  None   Discharge Instructions   None    ED Prescriptions   None    PDMP not reviewed this encounter.   Rose Phi, [place] 03/20/22 1110

## 2022-03-21 ENCOUNTER — Ambulatory Visit: Payer: BC Managed Care – PPO

## 2022-05-01 IMAGING — MR MR ABDOMEN WO/W CM MRCP
19 of 20 series · 45 of 48 positions shown · IV contrast (8ml Gadavist)
Comparison: No prior abdominal MRI. Abdominal ultrasound
12/28/2020.

CLINICAL DATA: 34-year-old female with history of elevated liver
function tests. Gallstones. Intermittent epigastric pain since
[REDACTED].

EXAM:
MRI ABDOMEN WITHOUT AND WITH CONTRAST (INCLUDING MRCP)
TECHNIQUE: Multiplanar multisequence MR imaging of the abdomen was performed
both before and after the administration of intravenous contrast.
Heavily T2-weighted images of the biliary and pancreatic ducts were
obtained, and three-dimensional MRCP images were rendered by post
processing.
CONTRAST:  8mL GADAVIST GADOBUTROL 1 MMOL/ML IV SOLN

[Series 3: T2 · coronal · 6.0mm · 1.06mm/px · 1 of 28 slices shown (1 of 2)]
[im 1/28]
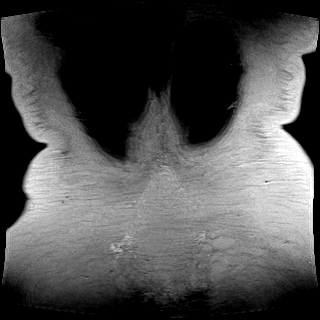

[Series 4: T2 · axial · 6.0mm · 1.19mm/px · 1 of 32 slices shown (2 of 2)]
[im 1/32]
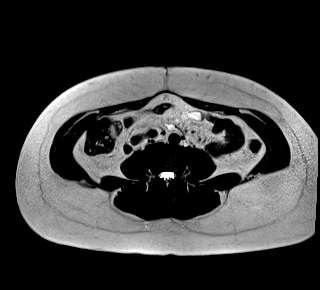

[Series 5: T1 · axial · 3.0mm · 1.19mm/px · z∈[+2,+239]mm · 2 of 80 slices shown (1 of 2)]
[im 1/80]
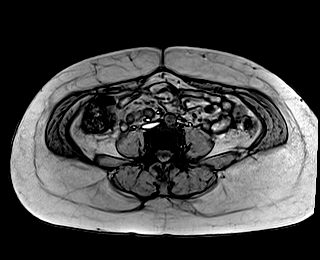
[im 80/80]
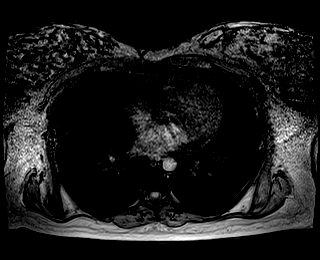

[Series 6: T1 · axial · 3.0mm · 1.19mm/px · z∈[+2,+239]mm · 3 of 80 slices shown (2 of 2)]
[im 1/80]
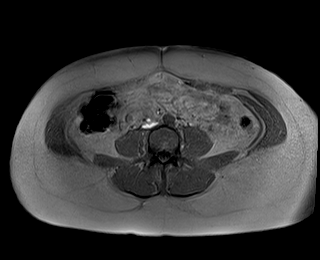
[im 40/80]
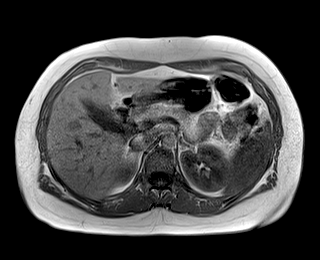
[im 80/80]
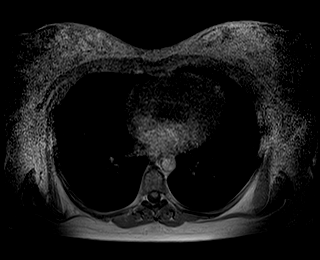

[Series 9: T2 fat-sat · axial · 6.0mm · 1.19mm/px · 1 of 34 slices shown]
[im 1/34]
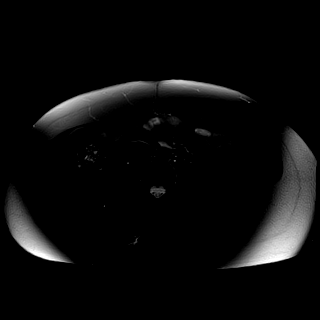

[Series 10: ax dwi_tracew · axial · 6.0mm · 1.42mm/px · z∈[+1,+239]mm · 4 of 102 slices shown]
[im 1/102]
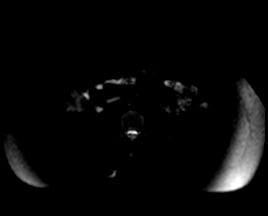
[im 34/102]
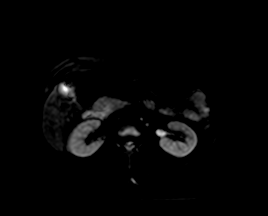
[im 68/102]
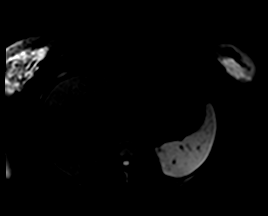
[im 102/102]
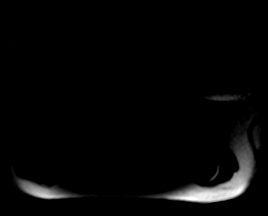

[Series 11: ax dwi_adc · axial · 6.0mm · 1.42mm/px · 1 of 34 slices shown]
[im 1/34]
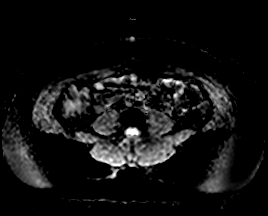

[Series 15: MRCP · coronal · 3.0mm · 1.00mm/px · 1 of 20 slices shown]
[im 1/20]
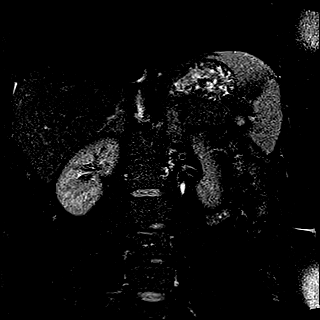

[Series 16: radials · coronal · 50.0mm · 0.78mm/px · 1 of 5 slices shown]
[im 1/5]
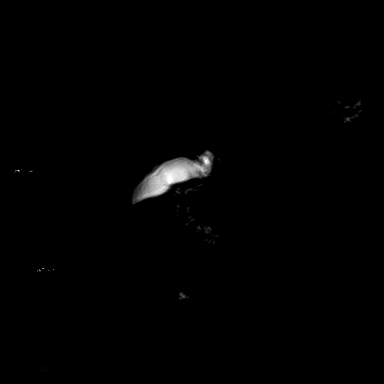

[Series 17: T1 dynamic fat-sat · axial · non-contrast · 3.0mm · 1.06mm/px · z∈[+14,+227]mm · 3 of 72 slices shown (1 of 5)]
[im 1/72]
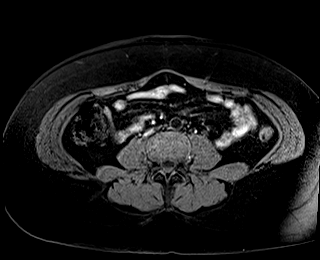
[im 36/72]
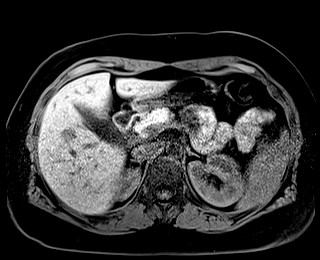
[im 72/72]
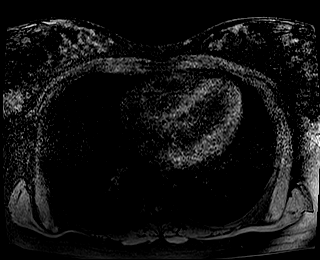

[Series 18: T1 dynamic fat-sat post-contrast · axial · 3.0mm · 1.06mm/px · z∈[+14,+227]mm · 3 of 72 slices shown (1 of 4)]
[im 1/72]
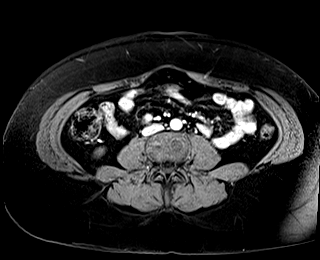
[im 36/72]
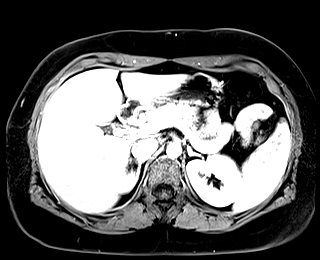
[im 72/72]
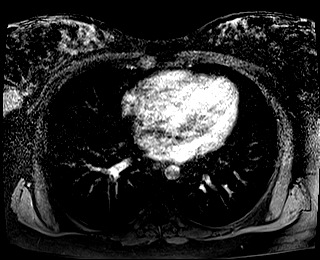

[Series 19: T1 dynamic fat-sat · axial · 3.0mm · 1.06mm/px · z∈[+14,+227]mm · 3 of 72 slices shown (2 of 5)]
[im 1/72]
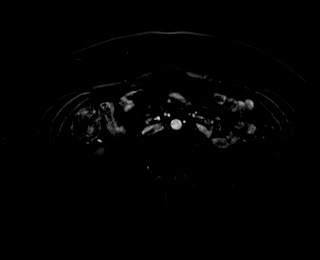
[im 36/72]
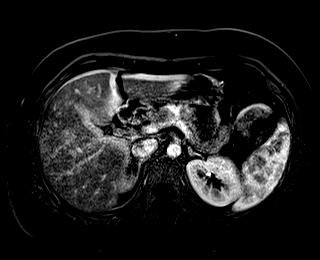
[im 72/72]
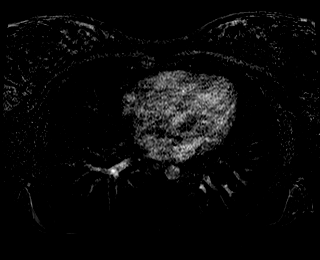

[Series 20: T1 dynamic fat-sat post-contrast · axial · 3.0mm · 1.06mm/px · z∈[+14,+227]mm · 3 of 72 slices shown (2 of 4)]
[im 1/72]
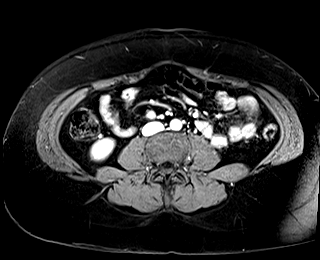
[im 36/72]
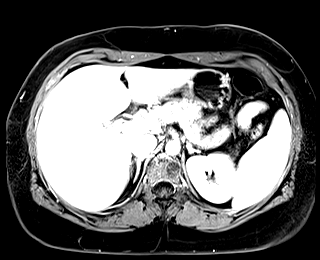
[im 72/72]
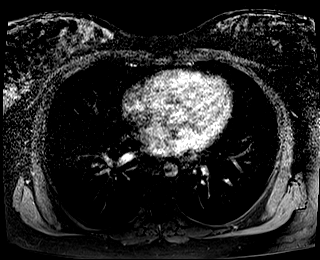

[Series 21: T1 dynamic fat-sat · axial · 3.0mm · 1.06mm/px · z∈[+14,+227]mm · 3 of 72 slices shown (3 of 5)]
[im 1/72]
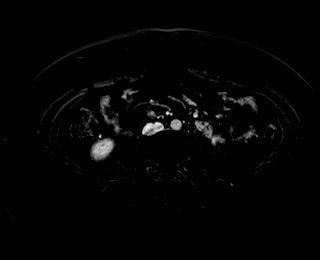
[im 36/72]
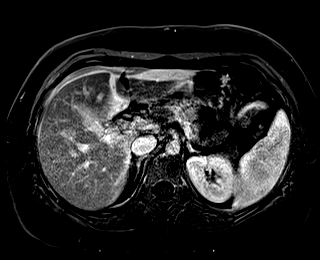
[im 72/72]
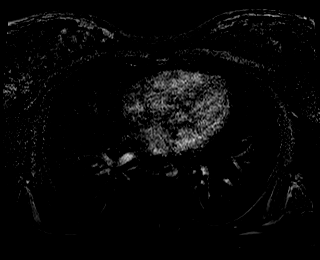

[Series 22: T1 dynamic fat-sat post-contrast · axial · 3.0mm · 1.06mm/px · z∈[+14,+227]mm · 3 of 72 slices shown (3 of 4)]
[im 1/72]
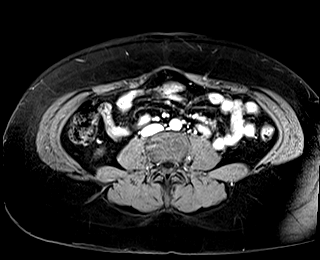
[im 36/72]
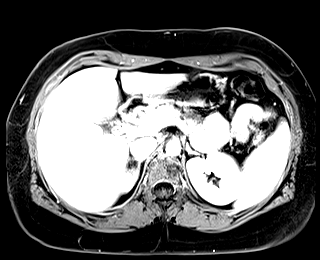
[im 72/72]
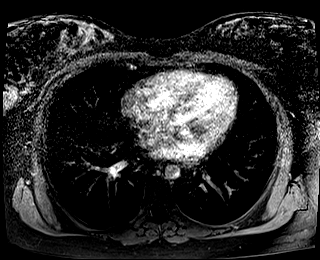

[Series 23: T1 dynamic fat-sat · axial · 3.0mm · 1.06mm/px · z∈[+14,+227]mm · 3 of 72 slices shown (4 of 5)]
[im 1/72]
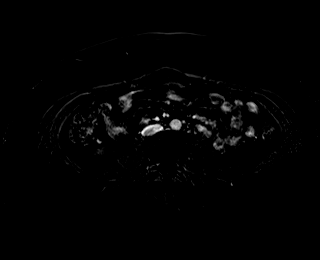
[im 36/72]
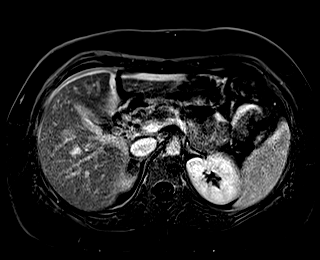
[im 72/72]
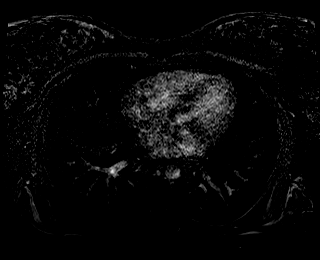

[Series 24: T1 dynamic post-contrast · coronal · 3.0mm · 1.06mm/px · 3 of 72 slices shown]
[im 1/72]
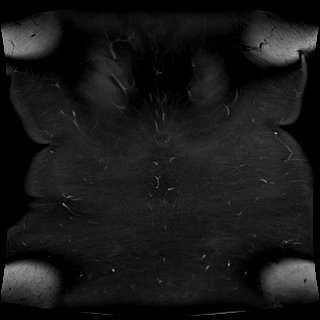
[im 36/72]
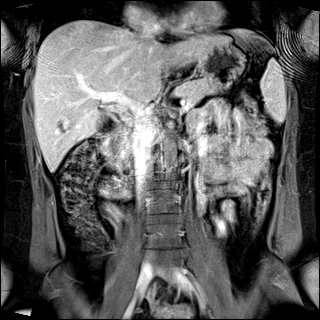
[im 72/72]
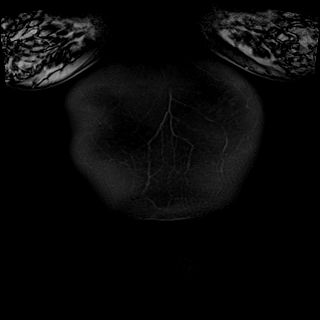

[Series 25: T1 dynamic fat-sat post-contrast · axial · 3.0mm · 1.06mm/px · z∈[+14,+227]mm · 3 of 72 slices shown (4 of 4)]
[im 1/72]
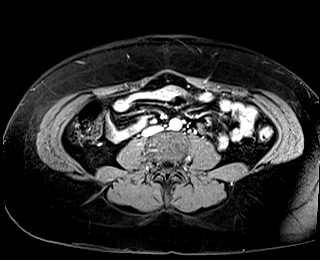
[im 36/72]
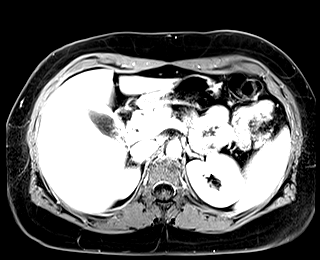
[im 72/72]
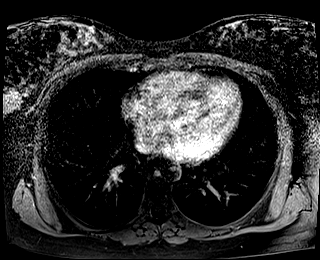

[Series 26: T1 dynamic fat-sat · axial · 3.0mm · 1.06mm/px · z∈[+14,+227]mm · 3 of 72 slices shown (5 of 5)]
[im 1/72]
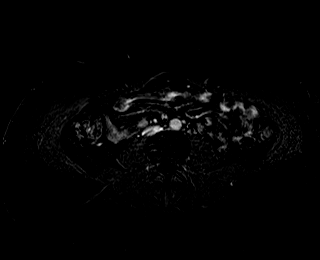
[im 36/72]
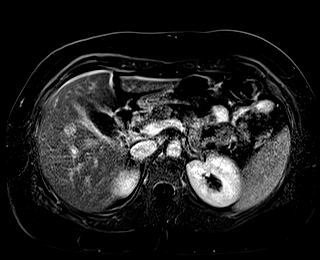
[im 72/72]
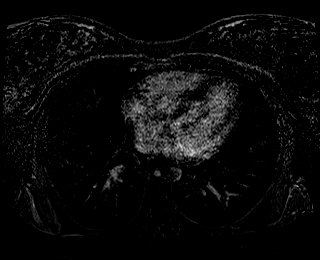

[45 of 48 positions shown; findings below may reference images not displayed]

FINDINGS: Lower chest: Unremarkable.

Hepatobiliary: In segment 5 of the liver (axial image 40 of series
17) there is a well-defined 2.1 x 1.8 cm lesion which is T1
hypointense, T2 hyperintense, demonstrates early peripheral nodular
enhancement with progressive filling (not centripetal). No other
suspicious hepatic lesions. No intra or extrahepatic biliary ductal
dilatation noted on MRCP images. Common bile duct measures 5 mm in
the porta hepatis. No filling defects in the common bile duct to
indicate choledocholithiasis. Tiny filling defect in the neck of the
gallbladder measuring 3 mm (axial image 18 of series 4). Gallbladder
is decompressed. Gallbladder wall does appear thickened and
edematous measuring up to 7 mm. No significant pericholecystic
fluid.

Pancreas: No pancreatic mass. No pancreatic ductal dilatation. No
pancreatic or peripancreatic fluid collections or inflammatory
changes.

Spleen:  Unremarkable.

Adrenals/Urinary Tract: Bilateral kidneys and bilateral adrenal
glands are normal in appearance. No hydroureteronephrosis in the
visualized portions of the abdomen.

Stomach/Bowel: Visualized portions are unremarkable.

Vascular/Lymphatic: No aneurysm identified in the visualized
abdominal vasculature. No lymphadenopathy noted in the abdomen.

Other: No significant volume of ascites noted in the visualized
portions of the peritoneal cavity.

Musculoskeletal: No aggressive appearing osseous lesions are noted
in the visualized portions of the skeleton.
IMPRESSION: 1. Study is positive for cholelithiasis. Gallbladder wall also
appears thickened and edematous. However, the gallbladder is not
distended, and there is no pericholecystic fluid. Additionally,
given the absence of a sonographic Murphy's sign on the recent
ultrasound examination, findings are not favored to reflect an acute
cholecystitis. If there is persistent clinical concern for acute
cholecystitis, further evaluation with nuclear medicine hepatic
biliary scan could be considered.
2. No choledocholithiasis or findings to suggest biliary tract
obstruction.
3. 2.1 x 1.8 cm lesion in segment 5 of the liver which has unusual
imaging characteristics. Overall, this is favored to represent an
unusual cavernous hemangioma, however, repeat abdominal MRI with and
without IV gadolinium is recommended in 6 months to ensure the
stability of this lesion given the slightly atypical imaging
findings.

## 2022-05-01 IMAGING — US US HEPATIC LIVER DOPPLER
2 series · 14 of 25 positions shown · non-contrast
Comparison: Abdominal ultrasound 12/28/2020, MRI today

CLINICAL DATA: 34-year-old female with right upper quadrant pain

EXAM:
DUPLEX ULTRASOUND OF LIVER
TECHNIQUE: Color and duplex Doppler ultrasound was performed to evaluate the
hepatic in-flow and out-flow vessels.

[Series 1: us liver doppler · 13 of 77 slices shown]
[im 1/77]
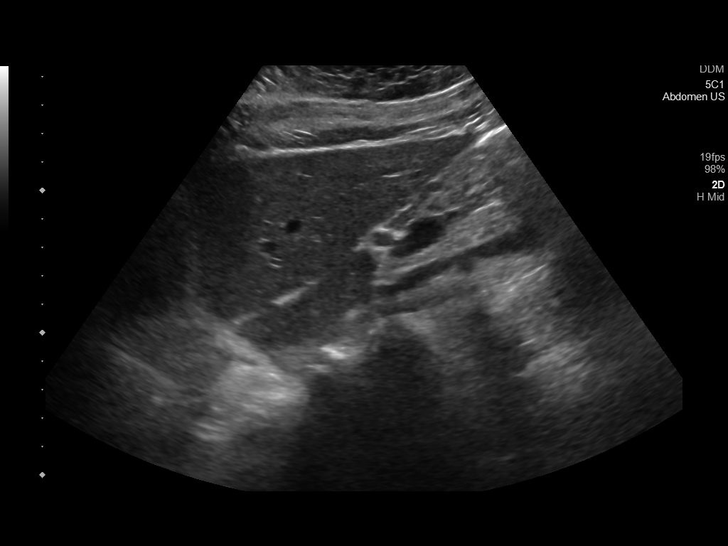
[im 7/77]
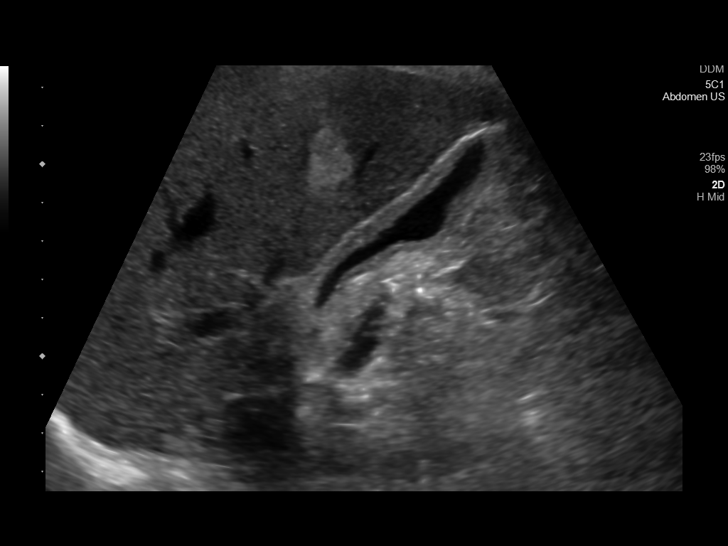
[im 14/77]
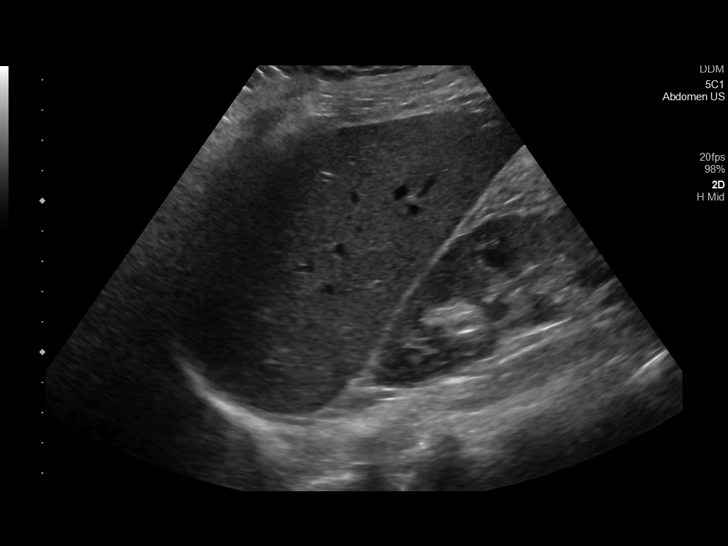
[im 20/77]
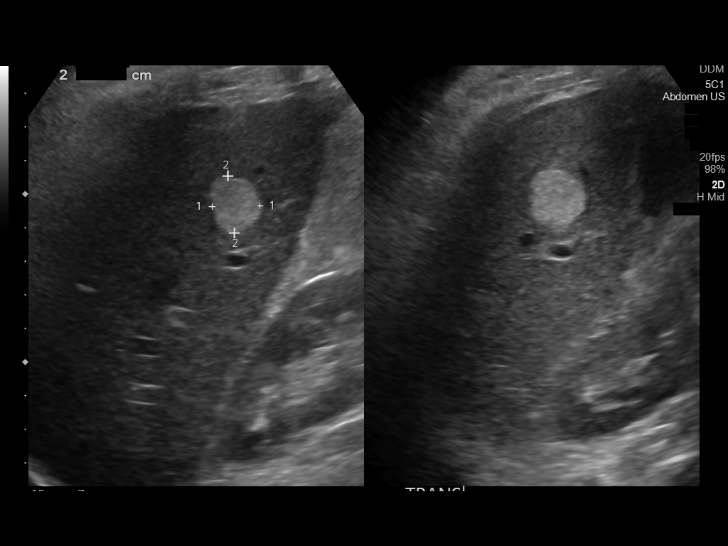
[im 27/77]
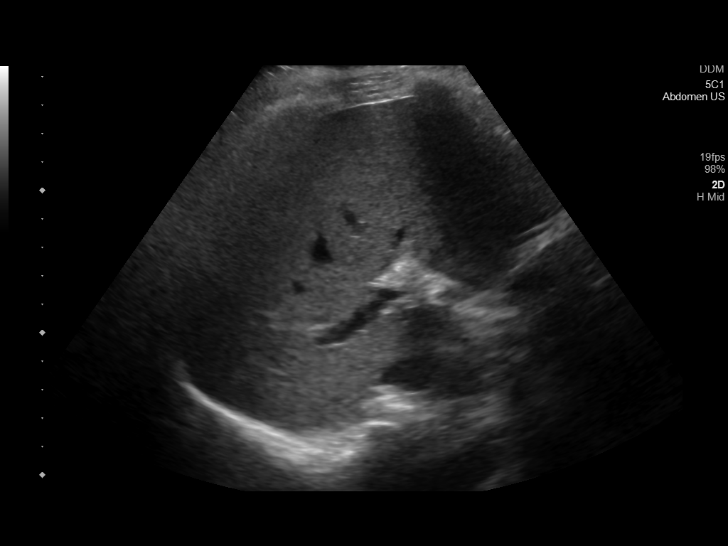
[im 30/77]
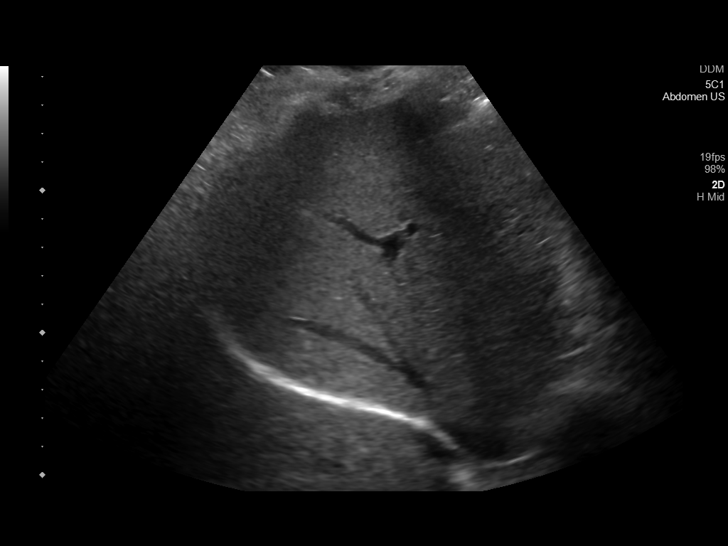
[im 37/77]
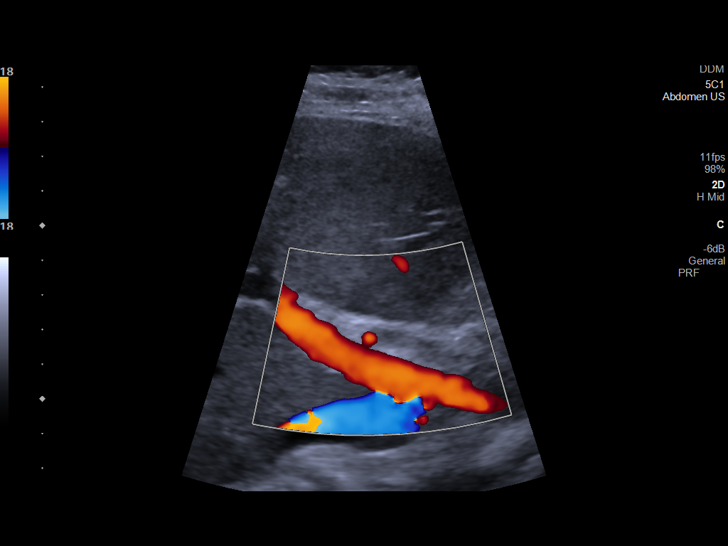
[im 43/77]
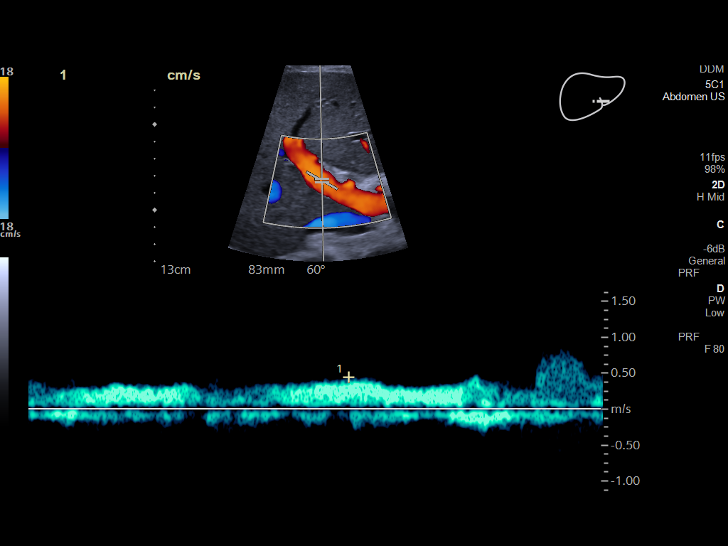
[im 50/77]
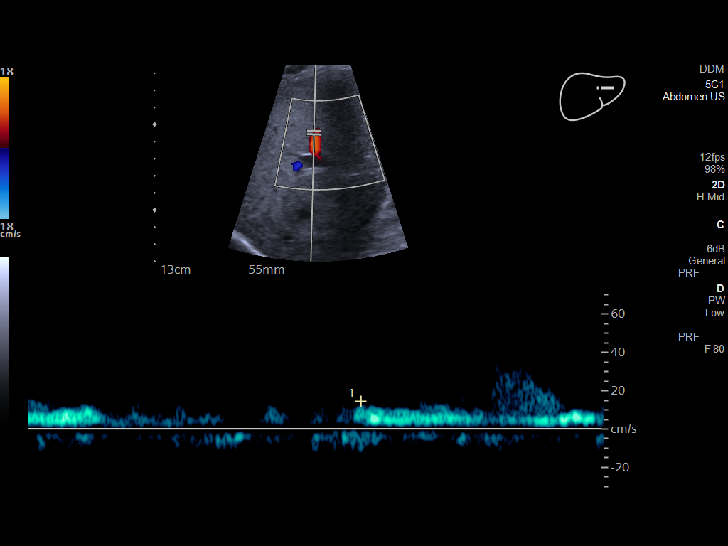
[im 53/77]
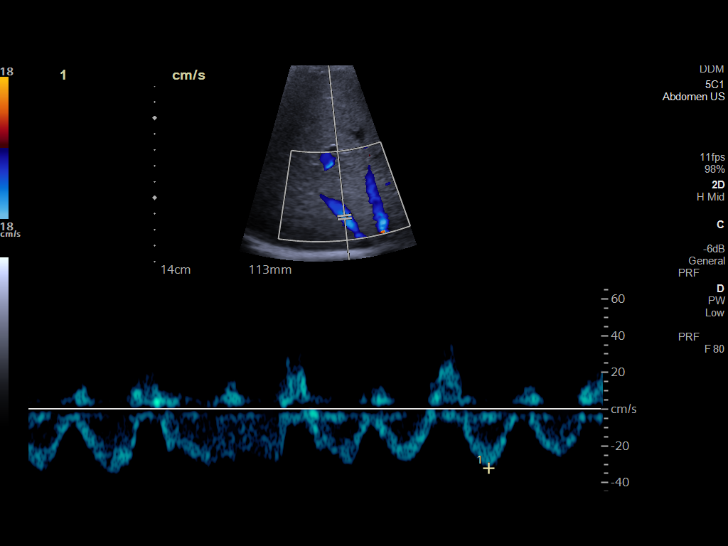
[im 60/77]
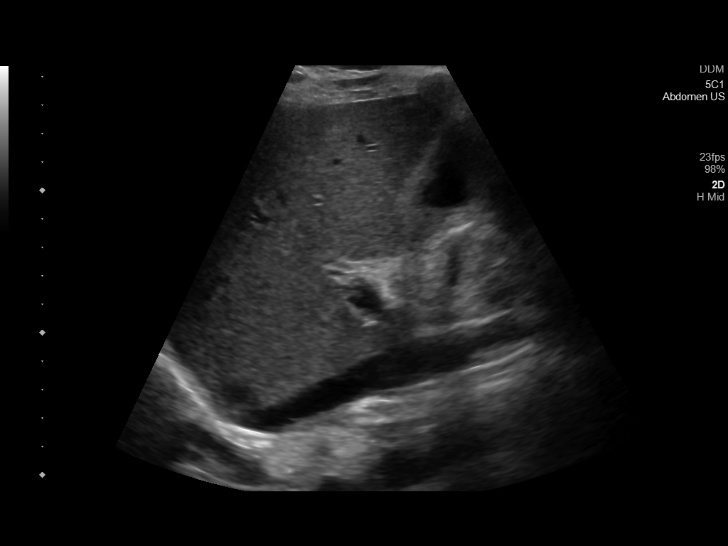
[im 67/77]
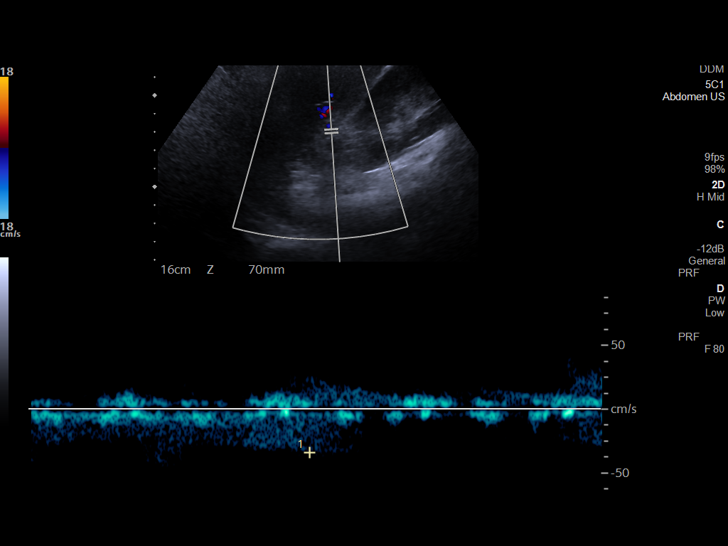
[im 73/77]
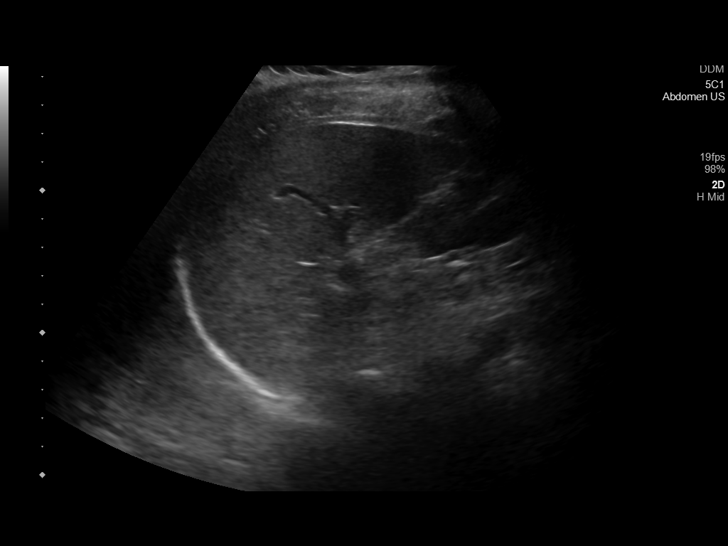

[Series 1001: abdomen us · 1 of 1 slices shown]
[im 1/1]
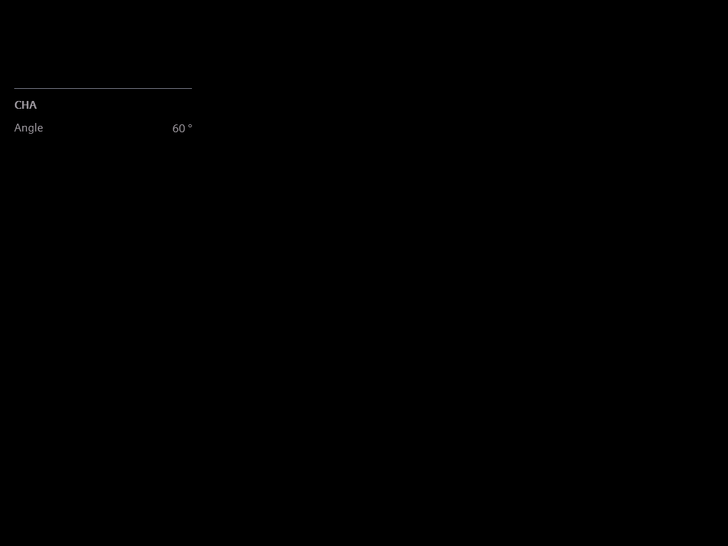

[14 of 25 positions shown; findings below may reference images not displayed]

FINDINGS: Portal Vein Velocities

Main:  44 cm/sec

Right:  39 cm/sec

Left:  15 cm/sec

Hepatic Vein Velocities

Right:  32 cm/sec

Middle:  27 cm/sec

Left:  34 cm/sec

Hepatic Artery Velocity:  78 cm/sec

Splenic Vein Velocity:  34 cm/sec

Varices: Absent

Ascites: Absent

Spleen size nonenlarged with a estimated volume 237 cc

Redemonstration of hyperechoic lesion within segment 6, 1.7 cm. This
lesion is better characterized on today's MRI and referral to this
report is recommended.

Cholelithiasis again demonstrated, better characterized on recent
ultrasound abdomen
IMPRESSION: Unremarkable directed duplex of the hepatic vasculature.

## 2022-05-12 ENCOUNTER — Ambulatory Visit
Admission: RE | Admit: 2022-05-12 | Discharge: 2022-05-12 | Disposition: A | Discharge status: Home / Self Care | Payer: BC Managed Care – PPO | Source: Ambulatory Visit

## 2022-05-12 VITALS — BP 135/79 | HR 78 | Temp 99.5°F | Resp 16 | Ht 62.0 in | Wt 200.0 lb

## 2022-05-12 DIAGNOSIS — J069 Acute upper respiratory infection, unspecified: Secondary | ICD-10-CM

## 2022-05-12 MED ORDER — CEFDINIR 300 MG PO CAPS: 300.0000 mg | ORAL_CAPSULE | Freq: Two times a day (BID) | ORAL | 0 refills | Status: AC

## 2022-05-12 MED ORDER — IPRATROPIUM BROMIDE 0.06 % NA SOLN: 2.0000 | Freq: Four times a day (QID) | NASAL | 12 refills | Status: AC

## 2022-05-12 MED ORDER — PROMETHAZINE-DM 6.25-15 MG/5ML PO SYRP: 5.0000 mL | ORAL_SOLUTION | Freq: Four times a day (QID) | ORAL | 0 refills | Status: AC | PRN

## 2022-05-12 MED ORDER — BENZONATATE 100 MG PO CAPS: 200.0000 mg | ORAL_CAPSULE | Freq: Three times a day (TID) | ORAL | 0 refills | Status: AC

## 2022-05-12 NOTE — Discharge Instructions (Signed)
The Cefdinir twice daily with food for 10 days for treatment of your URI.  Perform sinus irrigation 2-3 times a day with a NeilMed sinus rinse kit and distilled water.  Do not use tap water.  You can use plain over-the-counter Mucinex every 6 hours to break up the stickiness of the mucus so your body can clear it.  Increase your oral fluid intake to thin out your mucus so that is also able for your body to clear more easily.  Take an over-the-counter probiotic, such as Culturelle-align-activia, 1 hour after each dose of antibiotic to prevent diarrhea.  Use the Atrovent nasal spray, 2 squirts in each nostril every 6 hours, as needed for runny nose and postnasal drip.  Use the Tessalon Perles every 8 hours during the day.  Take them with a small sip of water.  They may give you some numbness to the base of your tongue or a metallic taste in your mouth, this is normal.  Use the Promethazine DM cough syrup at bedtime for cough and congestion.  It will make you drowsy so do not take it during the day.  If you develop any new or worsening symptoms return for reevaluation or see your primary care provider.

## 2022-05-12 NOTE — ED Provider Notes (Signed)
[facility]    CSN: [id number] Arrival date & time: 05/12/22  1654      History   Chief Complaint Chief Complaint  Patient presents with   Ear Fullness    Appt   Cough   Sore Throat    HPI Rhonda Yates is a 37 y.o. female.   HPI  37 year old female here for evaluation of respiratory complaints.  The patient reports that for the last week she has been experiencing bilateral ear pressure, sore throat, and a nonproductive cough.  She does endorse runny nose and nasal congestion as well.  Her hearing is muffled in the left ear.  She denies fever, shortness of breath, or wheezing.  She also denies ringing in her ear.  Past Medical History:  Diagnosis Date   Anxiety     Patient Active Problem List   Diagnosis Date Noted   SVD (spontaneous vaginal delivery)    Biliary colic [id number]   Elevated LFTs 12/29/2020   Encounter for care or examination of lactating mother 09/01/2020   Elevated blood pressure affecting pregnancy in third trimester, antepartum    Abnormal glucose affecting pregnancy 03/06/2020   Obesity affecting pregnancy in first trimester 03/06/2020    Past Surgical History:  Procedure Laterality Date   TONSILLECTOMY     TYMPANOSTOMY TUBE PLACEMENT     WISDOM TOOTH EXTRACTION      OB History     Gravida  1   Para  1   Term  1   Preterm      AB      Living  1      SAB      IAB      Ectopic      Multiple  0   Live Births  1            Home Medications    Prior to Admission medications   Medication Sig Start Date End Date Taking? Authorizing Provider  acetaminophen (TYLENOL) 500 MG tablet Take by mouth.   Yes [provider]  benzonatate (TESSALON) 100 MG capsule Take 2 capsules (200 mg total) by mouth every 8 (eight) hours. 05/12/22  Yes Margarette Canada, NP  cefdinir (OMNICEF) 300 MG capsule Take 1 capsule (300 mg total) by mouth 2 (two) times daily for 10 days. 05/12/22 05/22/22 Yes Margarette Canada, NP   citalopram (CELEXA) 10 MG tablet Take 10 mg by mouth daily. 11/28/20  Yes [provider]  ipratropium (ATROVENT) 0.06 % nasal spray Place 2 sprays into both nostrils 4 (four) times daily. 05/12/22  Yes Margarette Canada, NP  Norgestimate-Ethinyl Estradiol Triphasic 0.18/0.215/0.25 MG-35 MCG tablet Take 1 tablet by mouth daily. 04/22/22  Yes [provider]  Prenatal Vit-Fe Fumarate-FA (PRENATAL PO) Take 1 tablet by mouth daily.   Yes [provider]  Probiotic Product (PROBIOTIC PO) Take by mouth daily.   Yes [provider]  promethazine-dextromethorphan (PROMETHAZINE-DM) 6.25-15 MG/5ML syrup Take 5 mLs by mouth 4 (four) times daily as needed. 05/12/22  Yes Margarette Canada, NP    Family History Family History  Problem Relation Age of Onset   Kidney cancer Mother    High blood pressure Mother    Cancer Mother        Neuroendocrine cancer   Healthy Father     Social History Social History   Tobacco Use   Smoking status: Never   Smokeless tobacco: Never  Vaping Use   Vaping Use: Never used  Substance Use  Topics   Alcohol use: Yes    Comment: social   Drug use: Never     Allergies   Penicillins   Review of Systems Review of Systems  Constitutional:  Negative for fever.  HENT:  Positive for congestion, ear pain, hearing loss, rhinorrhea and sore throat. Negative for tinnitus.   Respiratory:  Positive for cough. Negative for shortness of breath and wheezing.      Physical Exam Triage Vital Signs ED Triage Vitals  Enc Vitals Group     BP 05/12/22 1727 135/79     Pulse Rate 05/12/22 1727 78     Resp 05/12/22 1727 16     Temp 05/12/22 1727 99.5 F (37.5 C)     Temp Source 05/12/22 1727 Oral     SpO2 05/12/22 1727 96 %     Weight 05/12/22 1725 200 lb (90.7 kg)     Height 05/12/22 1725 5' 2"$  (1.575 m)     Head Circumference --      Peak Flow --      Pain Score 05/12/22 1725 4     Pain Loc --      Pain Edu? --      Excl. in [place]? --    No  data found.  Updated Vital Signs BP 135/79 (BP Location: Left Arm)   Pulse 78   Temp 99.5 F (37.5 C) (Oral)   Resp 16   Ht 5' 2"$  (1.575 m)   Wt 200 lb (90.7 kg)   LMP 04/14/2022 (Approximate)   SpO2 96%   Breastfeeding No   BMI 36.58 kg/m   Visual Acuity Right Eye Distance:   Left Eye Distance:   Bilateral Distance:    Right Eye Near:   Left Eye Near:    Bilateral Near:     Physical Exam Vitals and nursing note reviewed.  Constitutional:      Appearance: Normal appearance. She is not ill-appearing.  HENT:     Head: Normocephalic and atraumatic.     Right Ear: Ear canal and external ear normal. There is no impacted cerumen.     Left Ear: Ear canal and external ear normal. There is no impacted cerumen.     Ears:     Comments: Bilateral tympanic membranes are mildly erythematous but free of injection.  No effusion noted.    Nose: Congestion and rhinorrhea present.     Comments: Nasal mucosa is erythematous and mildly edematous with thick yellow discharge bilaterally.    Mouth/Throat:     Mouth: Mucous membranes are moist.     Pharynx: Oropharynx is clear. Posterior oropharyngeal erythema present. No oropharyngeal exudate.     Comments: Posterior oropharynx demonstrates erythema and injection with clear postnasal drip. Cardiovascular:     Rate and Rhythm: Normal rate and regular rhythm.     Pulses: Normal pulses.     Heart sounds: Normal heart sounds. No murmur heard.    No friction rub. No gallop.  Pulmonary:     Effort: Pulmonary effort is normal.     Breath sounds: Normal breath sounds. No wheezing, rhonchi or rales.  Musculoskeletal:     Cervical back: Normal range of motion and neck supple.  Lymphadenopathy:     Cervical: No cervical adenopathy.  Skin:    General: Skin is warm and dry.     Capillary Refill: Capillary refill takes less than 2 seconds.     Findings: No erythema or rash.  Neurological:     General: No  focal deficit present.     Mental Status:  She is alert and oriented to person, place, and time.  Psychiatric:        Mood and Affect: Mood normal.        Behavior: Behavior normal.        Thought Content: Thought content normal.        Judgment: Judgment normal.      UC Treatments / Results  Labs (all labs ordered are listed, but only abnormal results are displayed) Labs Reviewed - No data to display  EKG   Radiology No results found.  Procedures Procedures (including critical care time)  Medications Ordered in UC Medications - No data to display  Initial Impression / Assessment and Plan / UC Course  I have reviewed the triage vital signs and the nursing notes.  Pertinent labs & imaging results that were available during my care of the patient were reviewed by me and considered in my medical decision making (see chart for details).   Patient is a nontoxic-appearing 37 year old female here for evaluation of 1 weeks worth of respiratory complaints as outlined in HPI above.  On exam she does have inflamed nasal mucosa with thick yellow discharge in both nares.  Bilateral tympanic membranes are mildly erythematous but free of injection or effusion.  Oropharyngeal exam reveals erythema and injection with postnasal drip.  Cardiopulmonary exam reveals clear lung sounds in all fields.  Patient exam is consistent with an upper respiratory infection.  Given that she has had symptoms for a week I feel a trial of antibiotics is warranted.  I will put her on cefdinir 300 mg twice daily for 10 days for treatment of the URI.  She has an allergy to penicillins but she has taken cephalosporins in the past without issue, including cefdinir.  I will also prescribe Atrovent nasal spray to help with nasal congestion and Tessalon Perles and Promethazine DM cough syrup to help with cough and congestion.  Return precautions reviewed.   Final Clinical Impressions(s) / UC Diagnoses   Final diagnoses:  Upper respiratory tract infection,  unspecified type     Discharge Instructions      The Cefdinir twice daily with food for 10 days for treatment of your URI.  Perform sinus irrigation 2-3 times a day with a NeilMed sinus rinse kit and distilled water.  Do not use tap water.  You can use plain over-the-counter Mucinex every 6 hours to break up the stickiness of the mucus so your body can clear it.  Increase your oral fluid intake to thin out your mucus so that is also able for your body to clear more easily.  Take an over-the-counter probiotic, such as Culturelle-align-activia, 1 hour after each dose of antibiotic to prevent diarrhea.  Use the Atrovent nasal spray, 2 squirts in each nostril every 6 hours, as needed for runny nose and postnasal drip.  Use the Tessalon Perles every 8 hours during the day.  Take them with a small sip of water.  They may give you some numbness to the base of your tongue or a metallic taste in your mouth, this is normal.  Use the Promethazine DM cough syrup at bedtime for cough and congestion.  It will make you drowsy so do not take it during the day.  If you develop any new or worsening symptoms return for reevaluation or see your primary care provider.      ED Prescriptions     Medication Sig Dispense Auth.  Provider   cefdinir (OMNICEF) 300 MG capsule Take 1 capsule (300 mg total) by mouth 2 (two) times daily for 10 days. 20 capsule Margarette Canada, NP   benzonatate (TESSALON) 100 MG capsule Take 2 capsules (200 mg total) by mouth every 8 (eight) hours. 21 capsule Margarette Canada, NP   ipratropium (ATROVENT) 0.06 % nasal spray Place 2 sprays into both nostrils 4 (four) times daily. 15 mL Margarette Canada, NP   promethazine-dextromethorphan (PROMETHAZINE-DM) 6.25-15 MG/5ML syrup Take 5 mLs by mouth 4 (four) times daily as needed. 118 mL Margarette Canada, NP      PDMP not reviewed this encounter.   Margarette Canada, NP 05/12/22 1750

## 2022-05-12 NOTE — ED Triage Notes (Signed)
Pt c/o ear pressure,sore throat & cough x1 wk.
